# Patient Record
Sex: Male | Born: 1960 | Race: Black or African American | Hispanic: No | Marital: Married | Smoking: Former smoker
Health system: Southern US, Community
[De-identification: ages and names within clinical notes are randomized; demographics above are authoritative.]

## PROBLEM LIST (undated history)

## (undated) DIAGNOSIS — R21 Rash and other nonspecific skin eruption: Secondary | ICD-10-CM

## (undated) DIAGNOSIS — R9439 Abnormal result of other cardiovascular function study: Secondary | ICD-10-CM

## (undated) DIAGNOSIS — R519 Headache, unspecified: Secondary | ICD-10-CM

## (undated) DIAGNOSIS — E119 Type 2 diabetes mellitus without complications: Secondary | ICD-10-CM

## (undated) DIAGNOSIS — R51 Headache: Secondary | ICD-10-CM

## (undated) DIAGNOSIS — I10 Essential (primary) hypertension: Secondary | ICD-10-CM

## (undated) DIAGNOSIS — Z8639 Personal history of other endocrine, nutritional and metabolic disease: Secondary | ICD-10-CM

## (undated) HISTORY — DX: Abnormal result of other cardiovascular function study: R94.39

---

## 2005-11-19 ENCOUNTER — Encounter: Admission: RE | Admit: 2005-11-19 | Discharge: 2005-11-19 | Payer: Self-pay | Admitting: Family Medicine

## 2008-07-15 ENCOUNTER — Encounter: Admission: RE | Admit: 2008-07-15 | Discharge: 2008-07-15 | Payer: Self-pay | Admitting: Family Medicine

## 2011-05-14 ENCOUNTER — Ambulatory Visit
Admission: RE | Admit: 2011-05-14 | Discharge: 2011-05-14 | Disposition: A | Discharge status: Home / Self Care | Payer: 59 | Source: Ambulatory Visit | Attending: Family Medicine | Admitting: Family Medicine

## 2011-05-14 ENCOUNTER — Other Ambulatory Visit: Payer: Self-pay | Admitting: Family Medicine

## 2011-05-14 DIAGNOSIS — R52 Pain, unspecified: Secondary | ICD-10-CM

## 2013-12-31 ENCOUNTER — Other Ambulatory Visit: Payer: Self-pay | Admitting: Family Medicine

## 2013-12-31 ENCOUNTER — Ambulatory Visit (INDEPENDENT_AMBULATORY_CARE_PROVIDER_SITE_OTHER): Payer: Self-pay

## 2013-12-31 DIAGNOSIS — Z0289 Encounter for other administrative examinations: Secondary | ICD-10-CM

## 2013-12-31 DIAGNOSIS — Z021 Encounter for pre-employment examination: Secondary | ICD-10-CM

## 2014-09-12 NOTE — Progress Notes (Signed)
Please put orders in Epic surgery 09-22-14 pre op 09-21-14 Thanks

## 2014-09-14 ENCOUNTER — Ambulatory Visit: Payer: Self-pay | Admitting: Orthopedic Surgery

## 2014-09-14 NOTE — H&P (Signed)
Robert Sweeney is an 54 y.o. male.   Chief Complaint: left knee pain HPI: The patient is a 54 year old male who presents today for follow up of their knee. The patient is being followed for their left knee pain. They are now 10 week(s) out from injury (DOI 06/21/2014). Symptoms reported today include: pain. The patient feels that they are doing poorly. Current treatment includes: bracing, activity modification and NSAIDs. The following medication has been used for pain control: antiinflammatory medication. The patient presents today following MRI. Note for "Follow-up Knee": The patient is currently working with light duty restrictions of no lifting over 10 lbs and no squatting.  He follows up to go over the MRI of his knee. He is still having pain. Worse with activity, better with rest. He is 10 weeks status post his injury. He does have full thickness tear of the quad tendon. 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   No past medical history on file.  No past surgical history on file.  No family history on file. Social History:  has no tobacco, alcohol, and drug history on file.  Allergies: No Known Allergies   (Not in a hospital admission)  No results found for this or any previous visit (from the past 48 hour(s)). No results found.  Review of Systems  Constitutional: Negative.   HENT: Negative.   Eyes: Negative.   Respiratory: Negative.   Cardiovascular: Negative.   Gastrointestinal: Negative.   Genitourinary: Negative.   Musculoskeletal: Positive for joint pain.  Skin: Negative.   Neurological: Negative.   Psychiatric/Behavioral: Negative.     There were no vitals taken for this visit. Physical Exam  Constitutional: He is oriented to person, place, and time. He appears well-developed and well-nourished.  HENT:  Head: Normocephalic and atraumatic.  Eyes: Conjunctivae and EOM are normal. Pupils  are equal, round, and reactive to light.  Neck: Normal range of motion. Neck supple.  Cardiovascular: Normal rate and regular rhythm.   Respiratory: Effort normal and breath sounds normal.  GI: Soft. Bowel sounds are normal.  Musculoskeletal:  He does have extensor lag. He is neurologically intact. Tender in the medial joint line.  Neurological: He is alert and oriented to person, place, and time. He has normal reflexes.  Skin: Skin is warm and dry.  Psychiatric: He has a normal mood and affect.     Assessment/Plan 1. Quad tendon tear, now 10 weeks. 2. 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  As previously discussed, discussed repair of the quadriceps tendon. This has been delayed due to insurance approval coverage. Trying to maintain work status. Offered to keep him out of work, he declined. As far as restrictions, no squatting, no climbing, no lifting over 10 pounds. Discussed repair. Overnight in the hospital. In a brace. Three to four months until at maximal medical improvement. May require a delayed arthroscopic procedure. He understands that. His primary issue now is the quadriceps tendon. He does have a meniscal tear, DJD of the medial compartment as well. Spent over 25 minutes discussing the risks and benefits of the procedure including bleeding, infection, deep venous thrombosis, PE, and suboptimal range of motion, need for manipulation, need for arthroscopy in the future, etc. Clearance as soon as possible so we can repair those tendons. It is now 69 weeks old.  Plan left quadricep tendon repair, possible partial medial meniscectomy, possible patch graft  Robert Sweeney, Robert M. PA-C for Dr.  Beane 09/14/2014, 7:47 PM

## 2014-09-19 NOTE — Progress Notes (Signed)
Chest 1 view 12/31/13 on EPIC

## 2014-09-19 NOTE — Patient Instructions (Addendum)
Robert Sweeney  09/19/2014   Your procedure is scheduled on: Thursday 09/22/14  Report to [facility] Main  Entrance and follow signs to               Short Stay Center at 10:00 AM.  Call this number if you have problems the morning of surgery [phone]   Remember:  Do not eat food or drink liquids :After Midnight.                               You may not have any metal on your body including hair pins and              piercings  Do not wear jewelry, make-up, lotions, powders or perfumes.             Do not wear nail polish.  Do not shave  48 hours prior to surgery.              Men may shave face and neck.  Do not bring valuables to the hospital. [facility] IS NOT             RESPONSIBLE   FOR VALUABLES.  Contacts, dentures or bridgework may not be worn into surgery.  Leave suitcase in the car. After surgery it may be brought to your room.  _____________________________________________________________________           [facility] - Preparing for Surgery Before surgery, you can play an important role.  Because skin is not sterile, your skin needs to be as free of germs as possible.  You can reduce the number of germs on your skin by washing with CHG (chlorahexidine gluconate) soap before surgery.  CHG is an antiseptic cleaner which kills germs and bonds with the skin to continue killing germs even after washing. Please DO NOT use if you have an allergy to CHG or antibacterial soaps.  If your skin becomes reddened/irritated stop using the CHG and inform your nurse when you arrive at Short Stay. Do not shave (including legs and underarms) for at least 48 hours prior to the first CHG shower.  You may shave your face/neck. Please follow these instructions carefully:  1.  Shower with CHG Soap the night before surgery and the  morning of Surgery.  2.  If you choose to wash your hair, wash your hair first as usual with your  normal  shampoo.  3.  After you shampoo,  rinse your hair and body thoroughly to remove the  shampoo.                            4.  Use CHG as you would any other liquid soap.  You can apply chg directly  to the skin and wash                       Gently with a scrungie or clean washcloth.  5.  Apply the CHG Soap to your body ONLY FROM THE NECK DOWN.   Do not use on face/ open                           Wound or open sores. Avoid contact with eyes, ears mouth and genitals (private parts).  Wash face,  Genitals (private parts) with your normal soap.             6.  Wash thoroughly, paying special attention to the area where your surgery  will be performed.  7.  Thoroughly rinse your body with warm water from the neck down.  8.  DO NOT shower/wash with your normal soap after using and rinsing off  the CHG Soap.                9.  Pat yourself dry with a clean towel.            10.  Wear clean pajamas.            11.  Place clean sheets on your bed the night of your first shower and do not  sleep with pets. Day of Surgery : Do not apply any lotions/deodorants the morning of surgery.  Please wear clean clothes to the hospital/surgery center.  FAILURE TO FOLLOW THESE INSTRUCTIONS MAY RESULT IN THE CANCELLATION OF YOUR SURGERY PATIENT SIGNATURE_________________________________  NURSE SIGNATURE__________________________________  ________________________________________________________________________   Robert MireIncentive Spirometer  An incentive spirometer is a tool that can help keep your lungs clear and active. This tool measures how well you are filling your lungs with each breath. Taking long deep breaths may help reverse or decrease the chance of developing breathing (pulmonary) problems (especially infection) following:  A long period of time when you are unable to move or be active. BEFORE THE PROCEDURE   If the spirometer includes an indicator to show your best effort, your nurse or respiratory therapist will set  it to a desired goal.  If possible, sit up straight or lean slightly forward. Try not to slouch.  Hold the incentive spirometer in an upright position. INSTRUCTIONS FOR USE  1. Sit on the edge of your bed if possible, or sit up as far as you can in bed or on a chair. 2. Hold the incentive spirometer in an upright position. 3. Breathe out normally. 4. Place the mouthpiece in your mouth and seal your lips tightly around it. 5. Breathe in slowly and as deeply as possible, raising the piston or the ball toward the top of the column. 6. Hold your breath for 3-5 seconds or for as long as possible. Allow the piston or ball to fall to the bottom of the column. 7. Remove the mouthpiece from your mouth and breathe out normally. 8. Rest for a few seconds and repeat Steps 1 through 7 at least 10 times every 1-2 hours when you are awake. Take your time and take a few normal breaths between deep breaths. 9. The spirometer may include an indicator to show your best effort. Use the indicator as a goal to work toward during each repetition. 10. After each set of 10 deep breaths, practice coughing to be sure your lungs are clear. If you have an incision (the cut made at the time of surgery), support your incision when coughing by placing a pillow or rolled up towels firmly against it. Once you are able to get out of bed, walk around indoors and cough well. You may stop using the incentive spirometer when instructed by your caregiver.  RISKS AND COMPLICATIONS  Take your time so you do not get dizzy or light-headed.  If you are in pain, you may need to take or ask for pain medication before doing incentive spirometry. It is harder to take a deep breath if you are having pain.  AFTER USE  Rest and breathe slowly and easily.  It can be helpful to keep track of a log of your progress. Your caregiver can provide you with a simple table to help with this. If you are using the spirometer at home, follow these  instructions: [place] IF:   You are having difficultly using the spirometer.  You have trouble using the spirometer as often as instructed.  Your pain medication is not giving enough relief while using the spirometer.  You develop fever of 100.5 F (38.1 C) or higher. SEEK IMMEDIATE MEDICAL CARE IF:   You cough up bloody sputum that had not been present before.  You develop fever of 102 F (38.9 C) or greater.  You develop worsening pain at or near the incision site. MAKE SURE YOU:   Understand these instructions.  Will watch your condition.  Will get help right away if you are not doing well or get worse. Document Released: 11/04/2006 Document Revised: 09/16/2011 Document Reviewed: 01/05/2007 [facility] Patient Information 2014 [place], [place].   ________________________________________________________________________

## 2014-09-21 ENCOUNTER — Encounter (HOSPITAL_COMMUNITY): Payer: Self-pay

## 2014-09-21 ENCOUNTER — Encounter (HOSPITAL_COMMUNITY)
Admission: RE | Admit: 2014-09-21 | Discharge: 2014-09-21 | Disposition: A | Discharge status: Home / Self Care | Payer: Worker's Compensation | Source: Ambulatory Visit | Attending: Specialist | Admitting: Specialist

## 2014-09-21 DIAGNOSIS — E119 Type 2 diabetes mellitus without complications: Secondary | ICD-10-CM | POA: Diagnosis not present

## 2014-09-21 DIAGNOSIS — S76112A Strain of left quadriceps muscle, fascia and tendon, initial encounter: Secondary | ICD-10-CM | POA: Diagnosis present

## 2014-09-21 DIAGNOSIS — I10 Essential (primary) hypertension: Secondary | ICD-10-CM | POA: Diagnosis not present

## 2014-09-21 DIAGNOSIS — S76111A Strain of right quadriceps muscle, fascia and tendon, initial encounter: Secondary | ICD-10-CM | POA: Diagnosis not present

## 2014-09-21 DIAGNOSIS — X58XXXA Exposure to other specified factors, initial encounter: Secondary | ICD-10-CM | POA: Diagnosis not present

## 2014-09-21 DIAGNOSIS — Y9289 Other specified places as the place of occurrence of the external cause: Secondary | ICD-10-CM | POA: Diagnosis not present

## 2014-09-21 DIAGNOSIS — M179 Osteoarthritis of knee, unspecified: Secondary | ICD-10-CM | POA: Diagnosis not present

## 2014-09-21 HISTORY — DX: Headache, unspecified: R51.9

## 2014-09-21 HISTORY — DX: Headache: R51

## 2014-09-21 HISTORY — DX: Essential (primary) hypertension: I10

## 2014-09-21 HISTORY — DX: Personal history of other endocrine, nutritional and metabolic disease: Z86.39

## 2014-09-21 LAB — BASIC METABOLIC PANEL
ANION GAP: 8 (ref 5–15)
BUN: 17 mg/dL (ref 6–23)
CHLORIDE: 102 mmol/L (ref 96–112)
CO2: 30 mmol/L (ref 19–32)
CREATININE: 0.74 mg/dL (ref 0.50–1.35)
Calcium: 8.9 mg/dL (ref 8.4–10.5)
Glucose, Bld: 271 mg/dL — ABNORMAL HIGH (ref 70–99)
POTASSIUM: 4 mmol/L (ref 3.5–5.1)
SODIUM: 140 mmol/L (ref 135–145)

## 2014-09-21 LAB — CBC
HEMATOCRIT: 45.1 % (ref 39.0–52.0)
HEMOGLOBIN: 14.6 g/dL (ref 13.0–17.0)
MCH: 29.8 pg (ref 26.0–34.0)
MCHC: 32.4 g/dL (ref 30.0–36.0)
MCV: 92 fL (ref 78.0–100.0)
Platelets: 202 10*3/uL (ref 150–400)
RBC: 4.9 MIL/uL (ref 4.22–5.81)
RDW: 11.7 % (ref 11.5–15.5)
WBC: 6 10*3/uL (ref 4.0–10.5)

## 2014-09-21 NOTE — Progress Notes (Signed)
BMP results in epic per PAT visit 09/21/2014 sent to Dr Shelle IronBeane

## 2014-09-22 ENCOUNTER — Ambulatory Visit (HOSPITAL_COMMUNITY)
Admission: RE | Admit: 2014-09-22 | Discharge: 2014-09-23 | Disposition: A | Discharge status: Home / Self Care | Payer: Worker's Compensation | Source: Ambulatory Visit | Attending: Specialist | Admitting: Specialist

## 2014-09-22 ENCOUNTER — Ambulatory Visit (HOSPITAL_COMMUNITY): Payer: Worker's Compensation | Admitting: Anesthesiology

## 2014-09-22 ENCOUNTER — Encounter (HOSPITAL_COMMUNITY): Payer: Self-pay | Admitting: *Deleted

## 2014-09-22 ENCOUNTER — Encounter (HOSPITAL_COMMUNITY)
Admission: RE | Disposition: A | Discharge status: Home / Self Care | Payer: Self-pay | Source: Ambulatory Visit | Attending: Specialist

## 2014-09-22 DIAGNOSIS — S86919A Strain of unspecified muscle(s) and tendon(s) at lower leg level, unspecified leg, initial encounter: Secondary | ICD-10-CM | POA: Diagnosis present

## 2014-09-22 DIAGNOSIS — S76111A Strain of right quadriceps muscle, fascia and tendon, initial encounter: Secondary | ICD-10-CM | POA: Insufficient documentation

## 2014-09-22 DIAGNOSIS — I10 Essential (primary) hypertension: Secondary | ICD-10-CM | POA: Diagnosis not present

## 2014-09-22 DIAGNOSIS — X58XXXA Exposure to other specified factors, initial encounter: Secondary | ICD-10-CM | POA: Insufficient documentation

## 2014-09-22 DIAGNOSIS — E119 Type 2 diabetes mellitus without complications: Secondary | ICD-10-CM | POA: Insufficient documentation

## 2014-09-22 DIAGNOSIS — M179 Osteoarthritis of knee, unspecified: Secondary | ICD-10-CM | POA: Diagnosis not present

## 2014-09-22 DIAGNOSIS — Y9289 Other specified places as the place of occurrence of the external cause: Secondary | ICD-10-CM | POA: Insufficient documentation

## 2014-09-22 HISTORY — PX: QUADRICEPS TENDON REPAIR: SHX756

## 2014-09-22 LAB — CBC
HCT: 45.7 % (ref 39.0–52.0)
HEMOGLOBIN: 14.9 g/dL (ref 13.0–17.0)
MCH: 30.3 pg (ref 26.0–34.0)
MCHC: 32.6 g/dL (ref 30.0–36.0)
MCV: 92.9 fL (ref 78.0–100.0)
Platelets: 191 10*3/uL (ref 150–400)
RBC: 4.92 MIL/uL (ref 4.22–5.81)
RDW: 11.7 % (ref 11.5–15.5)
WBC: 8.4 10*3/uL (ref 4.0–10.5)

## 2014-09-22 LAB — CREATININE, SERUM
CREATININE: 0.93 mg/dL (ref 0.50–1.35)
GFR calc Af Amer: >90 mL/min (ref >90)
GFR calc non Af Amer: >90 mL/min (ref >90)

## 2014-09-22 LAB — GLUCOSE, CAPILLARY: Glucose-Capillary: 271 mg/dL — ABNORMAL HIGH (ref 70–99)

## 2014-09-22 SURGERY — REPAIR, TENDON, QUADRICEPS
Anesthesia: General | Laterality: Left

## 2014-09-22 MED ORDER — MAGNESIUM CITRATE PO SOLN: 1.0000 | Freq: Once | ORAL | Status: AC | PRN

## 2014-09-22 MED ORDER — METHOCARBAMOL 500 MG PO TABS: 500.0000 mg | ORAL_TABLET | Freq: Three times a day (TID) | ORAL | Status: DC | PRN

## 2014-09-22 MED ORDER — ACETAMINOPHEN 325 MG PO TABS: 650.0000 mg | ORAL_TABLET | Freq: Four times a day (QID) | ORAL | Status: DC | PRN

## 2014-09-22 MED ORDER — METOCLOPRAMIDE HCL 5 MG/ML IJ SOLN
INTRAMUSCULAR | Status: AC
  Filled 2014-09-22: qty 2

## 2014-09-22 MED ORDER — FENTANYL CITRATE 0.05 MG/ML IJ SOLN
INTRAMUSCULAR | Status: DC | PRN
  Administered 2014-09-22: 50 ug via INTRAVENOUS
  Administered 2014-09-22: 100 ug via INTRAVENOUS

## 2014-09-22 MED ORDER — BUPIVACAINE-EPINEPHRINE 0.5% -1:200000 IJ SOLN
INTRAMUSCULAR | Status: DC | PRN
  Administered 2014-09-22: 10 mL

## 2014-09-22 MED ORDER — HYDROMORPHONE HCL 1 MG/ML IJ SOLN
0.2500 mg | INTRAMUSCULAR | Status: DC | PRN
  Administered 2014-09-22 (×4): 0.5 mg via INTRAVENOUS

## 2014-09-22 MED ORDER — SODIUM CHLORIDE 0.9 % IJ SOLN
INTRAMUSCULAR | Status: AC
  Filled 2014-09-22: qty 10

## 2014-09-22 MED ORDER — ONDANSETRON HCL 4 MG/2ML IJ SOLN
INTRAMUSCULAR | Status: DC | PRN
  Administered 2014-09-22: 4 mg via INTRAVENOUS

## 2014-09-22 MED ORDER — SODIUM CHLORIDE 0.9 % IR SOLN
Status: DC | PRN
  Administered 2014-09-22: 500 mL

## 2014-09-22 MED ORDER — SENNOSIDES-DOCUSATE SODIUM 8.6-50 MG PO TABS: 1.0000 | ORAL_TABLET | Freq: Every evening | ORAL | Status: DC | PRN

## 2014-09-22 MED ORDER — MIDAZOLAM HCL 5 MG/5ML IJ SOLN
INTRAMUSCULAR | Status: DC | PRN
  Administered 2014-09-22: 2 mg via INTRAVENOUS

## 2014-09-22 MED ORDER — OXYCODONE HCL 5 MG PO TABS: 5.0000 mg | ORAL_TABLET | Freq: Once | ORAL | Status: DC | PRN

## 2014-09-22 MED ORDER — PROMETHAZINE HCL 25 MG/ML IJ SOLN: 6.2500 mg | INTRAMUSCULAR | Status: DC | PRN

## 2014-09-22 MED ORDER — CEFAZOLIN SODIUM-DEXTROSE 2-3 GM-% IV SOLR
2.0000 g | INTRAVENOUS | Status: AC
  Administered 2014-09-22: 2 g via INTRAVENOUS

## 2014-09-22 MED ORDER — CEFAZOLIN SODIUM-DEXTROSE 2-3 GM-% IV SOLR
2.0000 g | Freq: Four times a day (QID) | INTRAVENOUS | Status: AC
  Administered 2014-09-22 – 2014-09-23 (×2): 2 g via INTRAVENOUS
  Filled 2014-09-22 (×2): qty 50

## 2014-09-22 MED ORDER — HYDROMORPHONE HCL 1 MG/ML IJ SOLN: 1.0000 mg | INTRAMUSCULAR | Status: DC | PRN

## 2014-09-22 MED ORDER — MENTHOL 3 MG MT LOZG: 1.0000 | LOZENGE | OROMUCOSAL | Status: DC | PRN

## 2014-09-22 MED ORDER — KCL IN DEXTROSE-NACL 20-5-0.45 MEQ/L-%-% IV SOLN
INTRAVENOUS | Status: DC
  Administered 2014-09-23: 01:00:00 via INTRAVENOUS
  Filled 2014-09-22 (×2): qty 1000

## 2014-09-22 MED ORDER — OXYCODONE HCL 5 MG PO TABS
5.0000 mg | ORAL_TABLET | ORAL | Status: DC | PRN
  Administered 2014-09-22 – 2014-09-23 (×5): 10 mg via ORAL
  Filled 2014-09-22 (×5): qty 2

## 2014-09-22 MED ORDER — METOCLOPRAMIDE HCL 5 MG/ML IJ SOLN
INTRAMUSCULAR | Status: DC | PRN
  Administered 2014-09-22: 10 mg via INTRAVENOUS

## 2014-09-22 MED ORDER — ALUM & MAG HYDROXIDE-SIMETH 200-200-20 MG/5ML PO SUSP: 30.0000 mL | ORAL | Status: DC | PRN

## 2014-09-22 MED ORDER — DOCUSATE SODIUM 100 MG PO CAPS: 100.0000 mg | ORAL_CAPSULE | Freq: Two times a day (BID) | ORAL | Status: DC | PRN

## 2014-09-22 MED ORDER — METHOCARBAMOL 1000 MG/10ML IJ SOLN
500.0000 mg | Freq: Four times a day (QID) | INTRAVENOUS | Status: DC | PRN
  Administered 2014-09-22: 500 mg via INTRAVENOUS
  Filled 2014-09-22 (×2): qty 5

## 2014-09-22 MED ORDER — OXYCODONE-ACETAMINOPHEN 5-325 MG PO TABS: 1.0000 | ORAL_TABLET | ORAL | Status: DC | PRN

## 2014-09-22 MED ORDER — EPHEDRINE SULFATE 50 MG/ML IJ SOLN
INTRAMUSCULAR | Status: DC | PRN
  Administered 2014-09-22: 10 mg via INTRAVENOUS
  Administered 2014-09-22: 5 mg via INTRAVENOUS

## 2014-09-22 MED ORDER — HYDROMORPHONE HCL 1 MG/ML IJ SOLN
INTRAMUSCULAR | Status: AC
  Filled 2014-09-22: qty 1

## 2014-09-22 MED ORDER — MIDAZOLAM HCL 2 MG/2ML IJ SOLN
INTRAMUSCULAR | Status: AC
  Filled 2014-09-22: qty 2

## 2014-09-22 MED ORDER — GLYCOPYRROLATE 0.2 MG/ML IJ SOLN
INTRAMUSCULAR | Status: AC
  Filled 2014-09-22: qty 4

## 2014-09-22 MED ORDER — ONDANSETRON HCL 4 MG/2ML IJ SOLN: 4.0000 mg | Freq: Four times a day (QID) | INTRAMUSCULAR | Status: DC | PRN

## 2014-09-22 MED ORDER — DIPHENHYDRAMINE HCL 12.5 MG/5ML PO ELIX: 12.5000 mg | ORAL_SOLUTION | ORAL | Status: DC | PRN

## 2014-09-22 MED ORDER — DEXAMETHASONE SODIUM PHOSPHATE 10 MG/ML IJ SOLN
INTRAMUSCULAR | Status: DC | PRN
  Administered 2014-09-22: 10 mg via INTRAVENOUS

## 2014-09-22 MED ORDER — ONDANSETRON HCL 4 MG/2ML IJ SOLN
INTRAMUSCULAR | Status: AC
  Filled 2014-09-22: qty 2

## 2014-09-22 MED ORDER — ACETAMINOPHEN 650 MG RE SUPP: 650.0000 mg | Freq: Four times a day (QID) | RECTAL | Status: DC | PRN

## 2014-09-22 MED ORDER — FENTANYL CITRATE 0.05 MG/ML IJ SOLN
INTRAMUSCULAR | Status: AC
  Filled 2014-09-22: qty 5

## 2014-09-22 MED ORDER — METHOCARBAMOL 500 MG PO TABS
500.0000 mg | ORAL_TABLET | Freq: Four times a day (QID) | ORAL | Status: DC | PRN
  Administered 2014-09-22 – 2014-09-23 (×2): 500 mg via ORAL
  Filled 2014-09-22 (×2): qty 1

## 2014-09-22 MED ORDER — ONDANSETRON HCL 4 MG PO TABS: 4.0000 mg | ORAL_TABLET | Freq: Four times a day (QID) | ORAL | Status: DC | PRN

## 2014-09-22 MED ORDER — DEXAMETHASONE SODIUM PHOSPHATE 10 MG/ML IJ SOLN
INTRAMUSCULAR | Status: AC
  Filled 2014-09-22: qty 1

## 2014-09-22 MED ORDER — CISATRACURIUM BESYLATE 20 MG/10ML IV SOLN
INTRAVENOUS | Status: AC
  Filled 2014-09-22: qty 10

## 2014-09-22 MED ORDER — ENOXAPARIN SODIUM 30 MG/0.3ML ~~LOC~~ SOLN
30.0000 mg | Freq: Two times a day (BID) | SUBCUTANEOUS | Status: DC
  Filled 2014-09-22 (×3): qty 0.3

## 2014-09-22 MED ORDER — METOCLOPRAMIDE HCL 5 MG/ML IJ SOLN: 5.0000 mg | Freq: Three times a day (TID) | INTRAMUSCULAR | Status: DC | PRN

## 2014-09-22 MED ORDER — BISACODYL 5 MG PO TBEC: 5.0000 mg | DELAYED_RELEASE_TABLET | Freq: Every day | ORAL | Status: DC | PRN

## 2014-09-22 MED ORDER — LACTATED RINGERS IV SOLN
INTRAVENOUS | Status: DC
  Administered 2014-09-22 (×2): via INTRAVENOUS

## 2014-09-22 MED ORDER — PROPOFOL 10 MG/ML IV BOLUS
INTRAVENOUS | Status: AC
  Filled 2014-09-22: qty 20

## 2014-09-22 MED ORDER — OXYCODONE HCL 5 MG/5ML PO SOLN
5.0000 mg | Freq: Once | ORAL | Status: DC | PRN
  Filled 2014-09-22: qty 5

## 2014-09-22 MED ORDER — PHENOL 1.4 % MT LIQD
1.0000 | OROMUCOSAL | Status: DC | PRN
Start: 2014-09-22 — End: 2014-09-23

## 2014-09-22 MED ORDER — BUPIVACAINE-EPINEPHRINE (PF) 0.5% -1:200000 IJ SOLN
INTRAMUSCULAR | Status: AC
Start: 2014-09-22 — End: 2014-09-22
  Filled 2014-09-22: qty 30

## 2014-09-22 MED ORDER — METOCLOPRAMIDE HCL 10 MG PO TABS: 5.0000 mg | ORAL_TABLET | Freq: Three times a day (TID) | ORAL | Status: DC | PRN

## 2014-09-22 MED ORDER — CEFAZOLIN SODIUM-DEXTROSE 2-3 GM-% IV SOLR
INTRAVENOUS | Status: AC
Start: 2014-09-22 — End: 2014-09-22
  Filled 2014-09-22: qty 50

## 2014-09-22 MED ORDER — PROPOFOL 10 MG/ML IV BOLUS
INTRAVENOUS | Status: DC | PRN
  Administered 2014-09-22: 200 mg via INTRAVENOUS

## 2014-09-22 MED ORDER — EPHEDRINE SULFATE 50 MG/ML IJ SOLN
INTRAMUSCULAR | Status: AC
  Filled 2014-09-22: qty 1

## 2014-09-22 MED ORDER — DOCUSATE SODIUM 100 MG PO CAPS
100.0000 mg | ORAL_CAPSULE | Freq: Two times a day (BID) | ORAL | Status: DC
  Administered 2014-09-22 – 2014-09-23 (×2): 100 mg via ORAL

## 2014-09-22 MED ORDER — CISATRACURIUM BESYLATE (PF) 10 MG/5ML IV SOLN
INTRAVENOUS | Status: DC | PRN
  Administered 2014-09-22: 12 mg via INTRAVENOUS

## 2014-09-22 SURGICAL SUPPLY — 60 items
BAG ZIPLOCK 12X15 (MISCELLANEOUS) IMPLANT
BANDAGE ESMARK 6X9 LF (GAUZE/BANDAGES/DRESSINGS) ×1 IMPLANT
BIT DRILL 2.4X128 (BIT) IMPLANT
BIT DRILL 2.4X128MM (BIT)
BIT DRILL 2.8X128 (BIT) ×2 IMPLANT
BIT DRILL 2.8X128MM (BIT) ×1
BNDG ELASTIC 6X10 VLCR STRL LF (GAUZE/BANDAGES/DRESSINGS) ×3 IMPLANT
BNDG ELASTIC 6X15 VLCR STRL LF (GAUZE/BANDAGES/DRESSINGS) ×3 IMPLANT
BNDG ESMARK 6X9 LF (GAUZE/BANDAGES/DRESSINGS) ×3
CLOTH 2% CHLOROHEXIDINE 3PK (PERSONAL CARE ITEMS) ×3 IMPLANT
CUFF TOURN SGL QUICK 34 (TOURNIQUET CUFF) ×2
CUFF TRNQT CYL 34X4X40X1 (TOURNIQUET CUFF) ×1 IMPLANT
DERMABOND ADVANCED (GAUZE/BANDAGES/DRESSINGS) ×2
DERMABOND ADVANCED .7 DNX12 (GAUZE/BANDAGES/DRESSINGS) ×1 IMPLANT
DERMASPAN .5-.9MM 4X4CM SHOU (Miscellaneous) ×3 IMPLANT
DRAPE ORTHO SPLIT 77X108 STRL (DRAPES) ×4
DRAPE POUCH INSTRU U-SHP 10X18 (DRAPES) ×3 IMPLANT
DRAPE SHEET LG 3/4 BI-LAMINATE (DRAPES) IMPLANT
DRAPE SURG ORHT 6 SPLT 77X108 (DRAPES) ×2 IMPLANT
DRAPE U-SHAPE 47X51 STRL (DRAPES) ×3 IMPLANT
DRSG ADAPTIC 3X8 NADH LF (GAUZE/BANDAGES/DRESSINGS) IMPLANT
DRSG AQUACEL AG ADV 3.5X10 (GAUZE/BANDAGES/DRESSINGS) IMPLANT
DRSG PAD ABDOMINAL 8X10 ST (GAUZE/BANDAGES/DRESSINGS) IMPLANT
DURAPREP 26ML APPLICATOR (WOUND CARE) ×3 IMPLANT
ELECT REM PT RETURN 9FT ADLT (ELECTROSURGICAL) ×3
ELECTRODE REM PT RTRN 9FT ADLT (ELECTROSURGICAL) ×1 IMPLANT
GAUZE SPONGE 4X4 12PLY STRL (GAUZE/BANDAGES/DRESSINGS) IMPLANT
GLOVE BIOGEL PI IND STRL 7.5 (GLOVE) ×1 IMPLANT
GLOVE BIOGEL PI IND STRL 8 (GLOVE) ×1 IMPLANT
GLOVE BIOGEL PI INDICATOR 7.5 (GLOVE) ×2
GLOVE BIOGEL PI INDICATOR 8 (GLOVE) ×2
GLOVE SURG SS PI 7.5 STRL IVOR (GLOVE) ×3 IMPLANT
GLOVE SURG SS PI 8.0 STRL IVOR (GLOVE) ×3 IMPLANT
GOWN STRL REUS W/TWL XL LVL3 (GOWN DISPOSABLE) ×6 IMPLANT
IMMOBILIZER KNEE 20 (SOFTGOODS) ×3
IMMOBILIZER KNEE 20 THIGH 36 (SOFTGOODS) ×1 IMPLANT
KIT BASIN OR (CUSTOM PROCEDURE TRAY) ×3 IMPLANT
MANIFOLD NEPTUNE II (INSTRUMENTS) ×3 IMPLANT
NEEDLE HYPO 22GX1.5 SAFETY (NEEDLE) ×3 IMPLANT
NEEDLE MA TROC 1/2 CIR (NEEDLE) IMPLANT
NEEDLE MAYO .5 CIRCLE (NEEDLE) IMPLANT
PACK TOTAL JOINT (CUSTOM PROCEDURE TRAY) ×3 IMPLANT
PADDING CAST COTTON 6X4 STRL (CAST SUPPLIES) IMPLANT
PASSER SUT SWANSON 36MM LOOP (INSTRUMENTS) IMPLANT
POSITIONER SURGICAL ARM (MISCELLANEOUS) ×3 IMPLANT
STAPLER VISISTAT (STAPLE) ×3 IMPLANT
SUT ETHIBOND 5 LR DA (SUTURE) IMPLANT
SUT ETHIBOND NAB CT1 #1 30IN (SUTURE) IMPLANT
SUT FIBERWIRE #2 38 T-5 BLUE (SUTURE) ×6
SUT VIC AB 0 CT1 27 (SUTURE)
SUT VIC AB 0 CT1 27XBRD ANTBC (SUTURE) IMPLANT
SUT VIC AB 1 CT1 27 (SUTURE) ×4
SUT VIC AB 1 CT1 27XBRD ANTBC (SUTURE) ×2 IMPLANT
SUT VIC AB 2-0 CT1 27 (SUTURE) ×4
SUT VIC AB 2-0 CT1 TAPERPNT 27 (SUTURE) ×2 IMPLANT
SUTURE FIBERWR #2 38 T-5 BLUE (SUTURE) ×2 IMPLANT
SYRINGE 20CC LL (MISCELLANEOUS) ×3 IMPLANT
TOWEL OR 17X26 10 PK STRL BLUE (TOWEL DISPOSABLE) ×3 IMPLANT
TOWEL OR NON WOVEN STRL DISP B (DISPOSABLE) IMPLANT
WRAP KNEE MAXI GEL POST OP (GAUZE/BANDAGES/DRESSINGS) ×3 IMPLANT

## 2014-09-22 NOTE — H&P (View-Only) (Signed)
Robert Sweeney is an 53 y.o. male.   Chief Complaint: left knee pain HPI: The patient is a 53 year old male who presents today for follow up of their knee. The patient is being followed for their left knee pain. They are now 10 week(s) out from injury (DOI 06/21/2014). Symptoms reported today include: pain. The patient feels that they are doing poorly. Current treatment includes: bracing, activity modification and NSAIDs. The following medication has been used for pain control: antiinflammatory medication. The patient presents today following MRI. Note for "Follow-up Knee": The patient is currently working with light duty restrictions of no lifting over 10 lbs and no squatting.  He follows up to go over the MRI of his knee. He is still having pain. Worse with activity, better with rest. He is 10 weeks status post his injury. He does have full thickness tear of the quad tendon. 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   No past medical history on file.  No past surgical history on file.  No family history on file. Social History:  has no tobacco, alcohol, and drug history on file.  Allergies: No Known Allergies   (Not in a hospital admission)  No results found for this or any previous visit (from the past 48 hour(s)). No results found.  Review of Systems  Constitutional: Negative.   HENT: Negative.   Eyes: Negative.   Respiratory: Negative.   Cardiovascular: Negative.   Gastrointestinal: Negative.   Genitourinary: Negative.   Musculoskeletal: Positive for joint pain.  Skin: Negative.   Neurological: Negative.   Psychiatric/Behavioral: Negative.     There were no vitals taken for this visit. Physical Exam  Constitutional: He is oriented to person, place, and time. He appears well-developed and well-nourished.  HENT:  Head: Normocephalic and atraumatic.  Eyes: Conjunctivae and EOM are normal. Pupils  are equal, round, and reactive to light.  Neck: Normal range of motion. Neck supple.  Cardiovascular: Normal rate and regular rhythm.   Respiratory: Effort normal and breath sounds normal.  GI: Soft. Bowel sounds are normal.  Musculoskeletal:  He does have extensor lag. He is neurologically intact. Tender in the medial joint line.  Neurological: He is alert and oriented to person, place, and time. He has normal reflexes.  Skin: Skin is warm and dry.  Psychiatric: He has a normal mood and affect.     Assessment/Plan 1. Quad tendon tear, now 10 weeks. 2. 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  As previously discussed, discussed repair of the quadriceps tendon. This has been delayed due to insurance approval coverage. Trying to maintain work status. Offered to keep him out of work, he declined. As far as restrictions, no squatting, no climbing, no lifting over 10 pounds. Discussed repair. Overnight in the hospital. In a brace. Three to four months until at maximal medical improvement. May require a delayed arthroscopic procedure. He understands that. His primary issue now is the quadriceps tendon. He does have a meniscal tear, DJD of the medial compartment as well. Spent over 25 minutes discussing the risks and benefits of the procedure including bleeding, infection, deep venous thrombosis, PE, and suboptimal range of motion, need for manipulation, need for arthroscopy in the future, etc. Clearance as soon as possible so we can repair those tendons. It is now 10 weeks old.  Plan left quadricep tendon repair, possible partial medial meniscectomy, possible patch graft  BISSELL, JACLYN M. PA-C for Dr.   Beane 09/14/2014, 7:47 PM    

## 2014-09-22 NOTE — Anesthesia Procedure Notes (Signed)
Procedure Name: Intubation Date/Time: 09/22/2014 11:55 AM Performed by: Paulla DollyJOYCE, Jeremie Abdelaziz A Pre-anesthesia Checklist: Patient identified, Emergency Drugs available, Suction available, Patient being monitored and Timeout performed Patient Re-evaluated:Patient Re-evaluated prior to inductionOxygen Delivery Method: Circle system utilized Preoxygenation: Pre-oxygenation with 100% oxygen Intubation Type: Combination inhalational/ intravenous induction Ventilation: Mask ventilation without difficulty Laryngoscope Size: Mac and 4 Grade View: Grade II Tube type: Oral Tube size: 8.0 mm Number of attempts: 1 Airway Equipment and Method: Stylet Placement Confirmation: ETT inserted through vocal cords under direct vision,  breath sounds checked- equal and bilateral and positive ETCO2 Secured at: 22 cm Tube secured with: Tape Dental Injury: Teeth and Oropharynx as per pre-operative assessment

## 2014-09-22 NOTE — Transfer of Care (Signed)
Immediate Anesthesia Transfer of Care Note  Patient: Robert Sweeney  Procedure(s) Performed: Procedure(s): REPAIR LEFT QUADRICEP TENDON with patch graft (Left)  Patient Location: PACU  Anesthesia Type:General  Level of Consciousness: awake, sedated and patient cooperative  Airway & Oxygen Therapy: Patient Spontanous Breathing and Patient connected to face mask oxygen  Post-op Assessment: Report given to RN and Post -op Vital signs reviewed and stable  Post vital signs: Reviewed and stable  Last Vitals:  Filed Vitals:   09/22/14 1007  BP: 163/88  Pulse: 71  Temp: 36.7 C  Resp: 18    Complications: No apparent anesthesia complications

## 2014-09-22 NOTE — Discharge Instructions (Signed)
Remain in Bledsoe brace at all times, locked in full extension (knee straight) May weightbear on heel in bledsoe brace Keep incision and dressings clean and dry at all times Ice and elevate the left knee 5-6x/day 20 minutes each to reduce swelling, toes above the nose Take Aspirin 325mg  once daily to reduce risk of blood clots Follow up in 2 weeks with Dr. Shelle IronBeane in office to remove staples

## 2014-09-22 NOTE — Interval H&P Note (Signed)
History and Physical Interval Note:  09/22/2014 7:30 AM  Robert Sweeney  has presented today for surgery, with the diagnosis of QUADRICEP TENDON TEAR LEFT   The various methods of treatment have been discussed with the patient and family. After consideration of risks, benefits and other options for treatment, the patient has consented to  Procedure(s): REPAIR LEFT QUADRICEP TENDON  (Left) POSSIBLE PARTIAL MENISCECTOMY POSSIBLE PATCH GRAFT  (Left) as a surgical intervention .  The patient's history has been reviewed, patient examined, no change in status, stable for surgery.  I have reviewed the patient's chart and labs.  Questions were answered to the patient's satisfaction.     Edwar Coe C

## 2014-09-22 NOTE — Brief Op Note (Signed)
09/22/2014  1:21 PM  PATIENT:  Robert Sweeney  54 y.o. male  PRE-OPERATIVE DIAGNOSIS:  QUADRICEP TENDON TEAR LEFT   POST-OPERATIVE DIAGNOSIS:  QUADRICEP TENDON TEAR LEFT   PROCEDURE:  Procedure(s): REPAIR LEFT QUADRICEP TENDON with patch graft (Left)  SURGEON:  Surgeon(s) and Role:    * Jene EveryJeffrey Jamarquis Crull, MD - Primary  PHYSICIAN ASSISTANT:   ASSISTANTS: Bissell   ANESTHESIA:   general  EBL:  Total I/O In: 1000 [I.V.:1000] Out: 100 [Blood:100]  BLOOD ADMINISTERED:none  DRAINS: none   LOCAL MEDICATIONS USED:  MARCAINE     SPECIMEN:  No Specimen  DISPOSITION OF SPECIMEN:  N/A  COUNTS:  YES  TOURNIQUET:  * No tourniquets in log *  DICTATION: .Other Dictation: Dictation Number [id number]  PLAN OF CARE: Admit for overnight observation  PATIENT DISPOSITION:  PACU - hemodynamically stable.   Delay start of Pharmacological VTE agent (>24hrs) due to surgical blood loss or risk of bleeding: n0

## 2014-09-22 NOTE — Anesthesia Postprocedure Evaluation (Signed)
  Anesthesia Post-op Note  Patient: Robert Sweeney  Procedure(s) Performed: Procedure(s): REPAIR LEFT QUADRICEP TENDON with patch graft (Left)  Patient Location: PACU  Anesthesia Type:General  Level of Consciousness: awake and alert   Airway and Oxygen Therapy: Patient Spontanous Breathing  Post-op Pain: mild  Post-op Assessment: Post-op Vital signs reviewed  Post-op Vital Signs: stable  Last Vitals:  Filed Vitals:   09/22/14 1508  BP: 164/84  Pulse: 71  Temp: 36.6 C  Resp: 16    Complications: No apparent anesthesia complications

## 2014-09-22 NOTE — Anesthesia Preprocedure Evaluation (Addendum)
Anesthesia Evaluation  Patient identified by MRN, date of birth, ID band Patient awake    Reviewed: Allergy & Precautions, NPO status , Patient's Chart, lab work & pertinent test results  History of Anesthesia Complications Negative for: history of anesthetic complications  Airway Mallampati: I  TM Distance: >3 FB Neck ROM: Full    Dental  (+) Teeth Intact   Pulmonary neg pulmonary ROS, former smoker,  breath sounds clear to auscultation        Cardiovascular hypertension, Rhythm:Regular Rate:Normal     Neuro/Psych    GI/Hepatic negative GI ROS, Neg liver ROS,   Endo/Other  negative endocrine ROS  Renal/GU negative Renal ROS     Musculoskeletal   Abdominal   Peds  Hematology negative hematology ROS (+)   Anesthesia Other Findings   Reproductive/Obstetrics                            Anesthesia Physical Anesthesia Plan  ASA: II  Anesthesia Plan: General   Post-op Pain Management:    Induction: Intravenous  Airway Management Planned: Oral ETT  Additional Equipment:   Intra-op Plan:   Post-operative Plan: Extubation in OR  Informed Consent: I have reviewed the patients History and Physical, chart, labs and discussed the procedure including the risks, benefits and alternatives for the proposed anesthesia with the patient or authorized representative who has indicated his/her understanding and acceptance.   Dental advisory given  Plan Discussed with: CRNA and Surgeon  Anesthesia Plan Comments:         Anesthesia Quick Evaluation

## 2014-09-23 ENCOUNTER — Encounter (HOSPITAL_COMMUNITY): Payer: Self-pay | Admitting: Specialist

## 2014-09-23 DIAGNOSIS — S76111A Strain of right quadriceps muscle, fascia and tendon, initial encounter: Secondary | ICD-10-CM | POA: Diagnosis not present

## 2014-09-23 NOTE — Progress Notes (Signed)
Pt to d/c home. Crutches delivered to room before d/c. Medicated for pain before d/c. AVS reviewed and "My Chart" discussed with pt. Pt capable of verbalizing medications, dressing changes, signs and symptoms of infection, and follow-up appointments. Remains hemodynamically stable. No signs and symptoms of distress. Educated pt to return to ER in the case of SOB, dizziness, or chest pain.

## 2014-09-23 NOTE — Progress Notes (Addendum)
Subjective: 1 Day Post-Op Procedure(s) (LRB): REPAIR LEFT QUADRICEP TENDON with patch graft (Left) Patient reports pain as mild.  Pain well controlled with OxyIR. No other c/o. No calf pain, numbness, tingling. Feels ready to go home later today. Bledsoe brace has been fitted and feels it is much better than the knee immobilizer.  Objective: Vital signs in last 24 hours: Temp:  [97.5 F (36.4 C)-98.5 F (36.9 C)] 98.5 F (36.9 C) (03/18 0625) Pulse Rate:  [56-91] 72 (03/18 0625) Resp:  [15-25] 16 (03/18 0752) BP: (141-181)/(67-93) 144/73 mmHg (03/18 0625) SpO2:  [95 %-100 %] 97 % (03/18 0752) Weight:  [117.028 kg (258 lb)] 117.028 kg (258 lb) (03/17 1051)  Intake/Output from previous day: 03/17 0701 - 03/18 0700 In: 2055 [I.V.:2000; IV Piggyback:55] Out: 1150 [Urine:1050; Blood:100] Intake/Output this shift:     Recent Labs  09/21/14 1135 09/22/14 1614  HGB 14.6 14.9    Recent Labs  09/21/14 1135 09/22/14 1614  WBC 6.0 8.4  RBC 4.90 4.92  HCT 45.1 45.7  PLT 202 191    Recent Labs  09/21/14 1135 09/22/14 1614  NA 140  --   K 4.0  --   CL 102  --   CO2 30  --   BUN 17  --   CREATININE 0.74 0.93  GLUCOSE 271*  --   CALCIUM 8.9  --    No results for input(s): LABPT, INR in the last 72 hours.  Neurologically intact ABD soft Neurovascular intact Sensation intact distally Intact pulses distally Dorsiflexion/Plantar flexion intact Incision: dressing C/D/I and no drainage No cellulitis present Compartment soft no sign of DVT, no calf pain  Assessment/Plan: 1 Day Post-Op Procedure(s) (LRB): REPAIR LEFT QUADRICEP TENDON with patch graft (Left) Advance diet Up with therapy D/C IV fluids  Plan D/C after PT, will likely need walker or crutches, await PT recommendations Discussed D/C instructions May WB on heel in Bledsoe brace locked in extension which is to remain on at all times Follow up in office in 2 weeks for staple removal and xrays Will discuss  with Dr. Elissa LovettBeane  Shaka Zech M. 09/23/2014, 8:00 AM

## 2014-09-23 NOTE — Discharge Summary (Signed)
Patient ID: Robert BranchDavid A Guertin MRN: [id number] DOB/AGE: [birth year] 56 y.o.  Admit date: 09/22/2014 Discharge date: 09/23/2014  Admission Diagnoses:  Active Problems:   Tear of tendon of lower extremity   Discharge Diagnoses:  Same  Past Medical History  Diagnosis Date  . Hypertension     under control through diet and exercise  . History of diabetes mellitus, type II     diet controlled-"no medications needed anymore"  . Headache     sometimes    Surgeries: Procedure(s): REPAIR LEFT QUADRICEP TENDON with patch graft on 09/22/2014   Consultants:    Discharged Condition: Improved  Hospital Course: Robert BranchDavid A Wandell is an 54 y.o. male who was admitted 09/22/2014 for operative treatment of<principal problem not specified>. Patient has severe unremitting pain that affects sleep, daily activities, and work/hobbies. After pre-op clearance the patient was taken to the operating room on 09/22/2014 and underwent  Procedure(s): REPAIR LEFT QUADRICEP TENDON with patch graft.    Patient was given perioperative antibiotics: Anti-infectives    Start     Dose/Rate Route Frequency Ordered Stop   09/22/14 1800  ceFAZolin (ANCEF) IVPB 2 g/50 mL premix     2 g 100 mL/hr over 30 Minutes Intravenous Every 6 hours 09/22/14 1526 09/23/14 0105   09/22/14 1218  polymyxin B 500,000 Units, bacitracin 50,000 Units in sodium chloride irrigation 0.9 % 500 mL irrigation  Status:  Discontinued       As needed 09/22/14 1218 09/22/14 1329   09/22/14 1015  ceFAZolin (ANCEF) IVPB 2 g/50 mL premix     2 g 100 mL/hr over 30 Minutes Intravenous On call to O.R. 09/22/14 1007 09/22/14 1145       Patient was given sequential compression devices, early ambulation, and chemoprophylaxis to prevent DVT.  Patient benefited maximally from hospital stay and there were no complications.    Recent vital signs: Patient Vitals for the past 24 hrs:  BP Temp Temp src Pulse Resp SpO2 Height Weight  09/23/14 0752 - - - - 16 97 % - -   09/23/14 0625 (!) 144/73 mmHg 98.5 F (36.9 C) Oral 72 18 98 % - -  09/22/14 2133 (!) 158/67 mmHg 98.3 F (36.8 C) Oral 89 20 95 % - -  09/22/14 1808 (!) 141/88 mmHg 98.2 F (36.8 C) Axillary 91 20 97 % - -  09/22/14 1708 (!) 152/80 mmHg 97.6 F (36.4 C) Oral 72 20 98 % - -  09/22/14 1608 (!) 146/79 mmHg 97.5 F (36.4 C) Oral 74 20 100 % - -  09/22/14 1600 - - - - 20 100 % - -  09/22/14 1508 (!) 164/84 mmHg 97.8 F (36.6 C) - 71 16 100 % - -  09/22/14 1445 (!) 177/86 mmHg 97.8 F (36.6 C) - 72 (!) 21 99 % - -  09/22/14 1430 (!) 170/89 mmHg - - 64 19 98 % - -  09/22/14 1415 (!) 178/86 mmHg - - (!) 56 (!) 25 99 % - -  09/22/14 1410 (!) 178/91 mmHg - - - 19 100 % - -  09/22/14 1400 (!) 181/93 mmHg - - 77 (!) 21 99 % - -  09/22/14 1345 (!) 160/88 mmHg - - 65 (!) 21 100 % - -  09/22/14 1334 (!) 143/84 mmHg 97.5 F (36.4 C) - 70 15 96 % - -  09/22/14 1051 - - - - - - 6' 5.5" (1.969 m) 117.028 kg (258 lb)  09/22/14 1007 (!) 163/88 mmHg 98.1  F (36.7 C) Oral 71 18 97 % - -     Recent laboratory studies:  Recent Labs  09/21/14 1135 09/22/14 1614  WBC 6.0 8.4  HGB 14.6 14.9  HCT 45.1 45.7  PLT 202 191  NA 140  --   K 4.0  --   CL 102  --   CO2 30  --   BUN 17  --   CREATININE 0.74 0.93  GLUCOSE 271*  --   CALCIUM 8.9  --      Discharge Medications:     Medication List    TAKE these medications        docusate sodium 100 MG capsule  Commonly known as:  COLACE  Take 1 capsule (100 mg total) by mouth 2 (two) times daily as needed for mild constipation.     methocarbamol 500 MG tablet  Commonly known as:  ROBAXIN  Take 1 tablet (500 mg total) by mouth 3 (three) times daily between meals as needed for muscle spasms.     multivitamin with minerals Tabs tablet  Take 1 tablet by mouth every morning.     oxyCODONE-acetaminophen 5-325 MG per tablet  Commonly known as:  PERCOCET  Take 1 tablet by mouth every 4 (four) hours as needed.        Diagnostic Studies: No  results found.  Disposition: Final discharge disposition not confirmed      Discharge Instructions    Call MD / Call 911    Complete by:  As directed   If you experience chest pain or shortness of breath, CALL 911 and be transported to the hospital emergency room.  If you develope a fever above 101 F, pus (white drainage) or increased drainage or redness at the wound, or calf pain, call your surgeon's office.     Constipation Prevention    Complete by:  As directed   Drink plenty of fluids.  Prune juice may be helpful.  You may use a stool softener, such as Colace (over the counter) 100 mg twice a day.  Use MiraLax (over the counter) for constipation as needed.     Diet - low sodium heart healthy    Complete by:  As directed      Increase activity slowly as tolerated    Complete by:  As directed            Follow-up Information    Follow up with BEANE,JEFFREY C, MD In 2 weeks.   Specialty:  Orthopedic Surgery   Why:  For suture removal   Contact information:   [address] [place] [address] [phone]        Signed: Dorothy Spark. 09/23/2014, 8:03 AM

## 2014-09-23 NOTE — Evaluation (Signed)
Physical Therapy Evaluation Patient Details Name: Robert Sweeney MRN: [id number] DOB: Jul 09, 1960 Today's Date: 09/23/2014   History of Present Illness  L quad tendon repair  Clinical Impression  Pt s/p L quad tendon repair presents with decreased L LE strength/ROM and post op pain limiting functional mobility.  Pt demonstrates ability to to mobilize with assist of bledsoe brace and crutches at Mod I level and plans to dc home with family assist.    Follow Up Recommendations No PT follow up    Equipment Recommendations  Crutches    Recommendations for Other Services       Precautions / Restrictions Precautions Precautions: Fall Required Braces or Orthoses: Knee Immobilizer - Left Knee Immobilizer - Left: On at all times Restrictions Weight Bearing Restrictions: No Other Position/Activity Restrictions: WBAT      Mobility  Bed Mobility Overal bed mobility: Modified Independent             General bed mobility comments: cued for technique and saftey awareness  Transfers Overall transfer level: Needs assistance Equipment used: None Transfers: Sit to/from Stand Sit to Stand: Supervision         General transfer comment: cued L LE management and use of UEs to self assist  Ambulation/Gait Ambulation/Gait assistance: Min guard;Supervision;Modified independent (Device/Increase time) Ambulation Distance (Feet): 300 Feet Assistive device: Rolling walker (2 wheeled);Crutches Gait Pattern/deviations: Step-to pattern;Decreased step length - left;Decreased step length - right;Shuffle     General Gait Details: cues for sequence, pace, wt on L heel and position from RW/crutches  Stairs Stairs: Yes Stairs assistance: Min guard Stair Management: No rails Number of Stairs: 3 (single step 3x) General stair comments: cues for sequence, foot/crutch placement and saftey awareness  Wheelchair Mobility    Modified Rankin (Stroke Patients Only)       Balance                                              Pertinent Vitals/Pain Pain Assessment: 0-10 Pain Score: 4  Pain Location: L knee Pain Descriptors / Indicators: Sore Pain Intervention(s): Limited activity within patient's tolerance;Monitored during session;Premedicated before session (pt declined ice pack)    Home Living Family/patient expects to be discharged to:: Private residence Living Arrangements: Spouse/significant other Available Help at Discharge: Family Type of Home: House Home Access: Stairs to enter Entrance Stairs-Rails: None Secretary/administrator of Steps: 1 Home Layout: One level Home Equipment: None      Prior Function Level of Independence: Independent               Hand Dominance        Extremity/Trunk Assessment   Upper Extremity Assessment: Overall WFL for tasks assessed           Lower Extremity Assessment: LLE deficits/detail   LLE Deficits / Details: Bloedsoe brace in place  Cervical / Trunk Assessment: Normal  Communication   Communication: No difficulties  Cognition Arousal/Alertness: Awake/alert Behavior During Therapy: WFL for tasks assessed/performed Overall Cognitive Status: Within Functional Limits for tasks assessed                      General Comments      Exercises        Assessment/Plan    PT Assessment Patent does not need any further PT services  PT Diagnosis Difficulty walking   PT Problem List  PT Treatment Interventions     PT Goals (Current goals can be found in the Care Plan section) Acute Rehab PT Goals Patient Stated Goal: HOME PT Goal Formulation: All assessment and education complete, DC therapy    Frequency     Barriers to discharge        Co-evaluation               End of Session   Activity Tolerance: Patient tolerated treatment well Patient left: in bed;with call bell/phone within reach Nurse Communication: Mobility status    Functional Assessment Tool Used:  clinical judgement Functional Limitation: Mobility: Walking and moving around Mobility: Walking and Moving Around Current Status ([id number](G8978): At least 1 percent but less than 20 percent impaired, limited or restricted Mobility: Walking and Moving Around Goal Status [phone](G8979): At least 1 percent but less than 20 percent impaired, limited or restricted Mobility: Walking and Moving Around Discharge Status [phone](G8980): At least 1 percent but less than 20 percent impaired, limited or restricted    Time: 0847-0915 PT Time Calculation (min) (ACUTE ONLY): 28 min   Charges:   PT Evaluation $Initial PT Evaluation Tier I: 1 Procedure PT Treatments $Gait Training: 8-22 mins   PT G Codes:   PT G-Codes **NOT FOR INPATIENT CLASS** Functional Assessment Tool Used: clinical judgement Functional Limitation: Mobility: Walking and moving around Mobility: Walking and Moving Around Current Status (B1478(G8978): At least 1 percent but less than 20 percent impaired, limited or restricted Mobility: Walking and Moving Around Goal Status [phone](G8979): At least 1 percent but less than 20 percent impaired, limited or restricted Mobility: Walking and Moving Around Discharge Status [phone](G8980): At least 1 percent but less than 20 percent impaired, limited or restricted    Long Island Community HospitalBRADSHAW,Robert Ambrosius 09/23/2014, 12:37 PM

## 2014-09-23 NOTE — Op Note (Signed)
NAME:  HANDY, MCLOUD NO.:  [id number]  MEDICAL RECORD NO.:  [id number]  LOCATION:  1620                         FACILITY:  [facility]  PHYSICIAN:  Jene Every, M.D.    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  PROCEDURES PERFORMED: 1. Quadricepsplasty of the left knee. 2. Debridement of scar tissue, left knee. 3. Quadriceps tendon repair of the left knee utilizing drill holes     through the patella. 4. Biomet patch graft to the quadriceps tendon.  ANESTHESIA:  General.  ASSISTANT:  Lanna Poche, PA.  HISTORY:  This is a pleasant gentleman, 19 who has had a quad tendon tear at work.  Unfortunately, he had a difficult time with workman's compensation getting the treatment approve.  When finally approved is over 3 months status post, he had a full-thickness quad tendon tear rupture, diagnosed by MRI.  He was indicated for repair of possible patch graft.  Risk and benefits were discussed including bleeding, infection, damage to neurovascular structures, no change in symptoms, worsening symptoms, DVT, PE, anesthetic complications, etc.  TECHNIQUE:  With the patient in supine position, after induction of adequate anesthesia, 2 g Kefzol, left lower extremity were prepped and draped in usual sterile fashion.  Midline incision was then made over the knee from the tibial tubercle to 4 cm above the superior pole of the patella.  The subcutaneous tissue was dissected.  Electrocautery was utilized to achieve hemostasis.  Immediately, identified the full- thickness tear in the tendon.  It was at the junction.  There was a small cm area of retained fibrous at the superior pole of the patella that was sporadic.  We debrided that to  the base of the superior pole of patella, and I fashioned a trough in the superior pole of the patella. We debrided the edges of the quadriceps tendon.  Then, we mobilized the quadriceps tendon on top of it and on the undersurface of it, performed a quad plasty.  There were some adhesions to the femur.  Flexed the knee, irrigated seroma, copious antibiotic irrigation.  I then used FiberWire and a Krackow type stitch up through the quadriceps tendon through 3 parallel drill holes in the patella from superior to inferior making small incisions in the patellar ligament and threading the FiberWire through that.  We delivered the quadriceps tendon to the bed provisionally and found just a very slight gap there in the center portion.  We therefore used a Biomet graft patch.  It was reconstituted in the appropriate fashion.  This was then rolled as an anchovy and delivered it into the area of the superior pole of the patella.  I filled then that small gap quite well.  We then reinforced the quadriceps delivery with towel clips, and then sutured with good surgeon's knot distally, taking the centered threads and threading 1 medially and 1 laterally, and tied it with a good surgical knot. Redundant suture removed.  We then oversewed and sewed the patch graft into that region, the superior pole of the patella with #1 Vicryl interrupted figure-of-eight sutures.  For a complete closure and the repair the retinaculum as well, this more intact laterally than medially.  I copiously irrigated the wound again and then subcu with 2-0 and skin with staples.  As we flexed the knee and looked inside the knee, we were unable to gain access to the meniscus and he did have some meniscus pathology.  Sterile dressing applied.  Placed in a knee immobilizer.  Extubated without difficulty, and transported to the recovery room in satisfactory condition.  The patient tolerated the procedure well.  No complications.   Assistant, Lanna PocheJacqueline Bissell, [place]PA.  Blood loss was 100 mL.     Jene EveryJeffrey Alegandra Sommers, M.D.     Cordelia PenJB/MEDQ  D:  09/22/2014  T:  09/23/2014  Job:  [id number]

## 2015-08-08 ENCOUNTER — Ambulatory Visit: Payer: Self-pay | Admitting: Orthopedic Surgery

## 2015-09-15 ENCOUNTER — Ambulatory Visit: Payer: Self-pay | Admitting: Orthopedic Surgery

## 2015-09-15 NOTE — H&P (Signed)
Robert Sweeney is an 55 y.o. male.   Chief Complaint: L knee pain HPI: The patient is a 55 year old male who presents today for follow up of their knee. The patient is being followed for their left quadriceps tendon repair. They are now 7 month(s) (1 1/2 wks) out from surgery (and 10 1/2 months out from injury. DOI 06/21/14). Symptoms reported today include: pain, giving way and instability (weakness). and report their pain level to be mild to moderate (4/10). Current treatment includes: activity modification and pain medications. The following medication has been used for pain control: Percocet. The patient presents today following MRI. Note for "Follow-up Knee": The patient is out of work. NCM Robert Sweeney.  Past Medical History  Diagnosis Date  . Hypertension     under control through diet and exercise  . History of diabetes mellitus, type II     diet controlled-"no medications needed anymore"  . Headache     sometimes    Past Surgical History  Procedure Laterality Date  . No past surgeries    . Quadriceps tendon repair Left 09/22/2014    Procedure: REPAIR LEFT QUADRICEP TENDON with patch graft;  Surgeon: Jene Every, MD;  Location: [facility] ORS;  Service: Orthopedics;  Laterality: Left;    No family history on file. Social History:  reports that he has quit smoking. His smoking use included Cigarettes. He has never used smokeless tobacco. He reports that he drinks alcohol. He reports that he does not use illicit drugs.  Allergies: No Known Allergies   (Not in a hospital admission)  No results found for this or any previous visit (from the past 48 hour(s)). No results found.  Review of Systems  Constitutional: Negative.   HENT: Negative.   Eyes: Negative.   Respiratory: Negative.   Cardiovascular: Negative.   Gastrointestinal: Negative.   Genitourinary: Negative.   Musculoskeletal: Positive for joint pain.  Skin: Negative.   Neurological: Negative.     There were no vitals  taken for this visit. Physical Exam  Constitutional: He appears well-developed.  HENT:  Head: Normocephalic.  Eyes: Pupils are equal, round, and reactive to light.  Neck: Normal range of motion.  Cardiovascular: Normal rate.   Respiratory: Effort normal.  GI: Soft.  Musculoskeletal:  On exam, he is exquisitely tender in the superior pole of the patella. He does have some superficial tenderness. He has a mild to moderate effusion. He is tender in the medial joint line. Equivocal McMurray, 0 to 110.  Knee exam on inspection reveals no evidence of soft tissue swelling, ecchymosis, deformity or erythema. On palpation there is no tenderness in the lateral joint line. No patellofemoral pain with compression. Nontender over the fibular head or the peroneal nerve. Nontender over the quadriceps insertion of the patellar ligament insertion. The range of motion was full. Provocative maneuvers revealed a negative Lachman, negative anterior and posterior drawer. No instability was noted with varus and valgus stressing at 0 or 30 degrees. On manual motor test the quadriceps and hamstrings were 5/5. Sensory exam was intact to light touch.  Neurological: He is alert.  Skin: Skin is warm.    He has had his MRI. He does have a flap tear in his meniscus noted on the MRI. It is in the medial meniscus. He has diffuse tricompartmental osteoarthritis, moderate to severe, involvement of the medial compartment; small Baker cyst. There is a small area of a 2 cm x 1 cm suspicious for a partial tendon avulsion  with granulation tissue and scarring.   Assessment/Plan L knee MMT, quad tear 1. Status post quad tendon repair with apparent small partial re-tear versus a non-healing portion of the distal quadriceps tendon. 2. 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 3. Chondral thinning and lateral facet of the patella.  Extensive discussion with  Robert Sweeney concerning current pathology, relevant anatomy, and treatment options separately in front of the patient with Robert Sweeney, case manager. He is having problems going up and down stairs. He is having persistent pain and swelling within the knee. He likely is symptomatic two fold, one from his small area of the partial re-tear of the quadriceps tendon and his underlying intraarticular pathology, which includes chondromalacia of the medial compartment and a small flap tear of the meniscus. I have offered him options at this point in time are to do nothing versus consideration of injection versus arthroscopic debridement, partial meniscectomy, and mini open augmentation of the previous repair of distal quadriceps tendon. It may require a debridement and side-to-side closure versus insertion of a partial patch graft. Certainly, it would not lead to the extent of his initial surgery. We discussed this as an outpatient to overnight and return to physical therapy and maximum medical improvement six weeks following the procedure. He has given to me a note in terms of his job duties. He is concerned that he will not be able to return to that. If he does lift excess of 50 to 100 pounds, carrying hoses, bending, climbing, twisting, do feel that residual symptoms even following this revision surgery would preclude return to that heavy job classification and most likely would require reassignment. He indicates he is going to school for other objectives at this point. I think that will be reasonable. I have scheduled for that procedure. Certainly, if there is any change in the interim, he is to call and we would anticipate residual symptoms related to his arthrosis. I do feel all this injury and his current symptomatology is stemming from his initial injury. We will schedule him accordingly.  Robert Sweeney, Robert Lodato M., PA-C for Dr. Shelle IronBeane 09/15/2015, 9:26 AM

## 2015-09-29 ENCOUNTER — Encounter (HOSPITAL_COMMUNITY): Payer: Self-pay

## 2015-09-29 ENCOUNTER — Encounter (HOSPITAL_COMMUNITY)
Admission: RE | Admit: 2015-09-29 | Discharge: 2015-09-29 | Disposition: A | Discharge status: Home / Self Care | Payer: Worker's Compensation | Source: Ambulatory Visit | Attending: Specialist | Admitting: Specialist

## 2015-09-29 DIAGNOSIS — E119 Type 2 diabetes mellitus without complications: Secondary | ICD-10-CM | POA: Insufficient documentation

## 2015-09-29 DIAGNOSIS — Z01812 Encounter for preprocedural laboratory examination: Secondary | ICD-10-CM | POA: Diagnosis not present

## 2015-09-29 DIAGNOSIS — Z01810 Encounter for preprocedural cardiovascular examination: Secondary | ICD-10-CM | POA: Diagnosis present

## 2015-09-29 HISTORY — DX: Type 2 diabetes mellitus without complications: E11.9

## 2015-09-29 LAB — CBC
HCT: 43 % (ref 39.0–52.0)
HEMOGLOBIN: 14.2 g/dL (ref 13.0–17.0)
MCH: 30.4 pg (ref 26.0–34.0)
MCHC: 33 g/dL (ref 30.0–36.0)
MCV: 92.1 fL (ref 78.0–100.0)
PLATELETS: 201 10*3/uL (ref 150–400)
RBC: 4.67 MIL/uL (ref 4.22–5.81)
RDW: 12.1 % (ref 11.5–15.5)
WBC: 7.6 10*3/uL (ref 4.0–10.5)

## 2015-09-29 LAB — BASIC METABOLIC PANEL
ANION GAP: 8 (ref 5–15)
BUN: 18 mg/dL (ref 6–20)
CHLORIDE: 99 mmol/L — AB (ref 101–111)
CO2: 29 mmol/L (ref 22–32)
Calcium: 8.9 mg/dL (ref 8.9–10.3)
Creatinine, Ser: 0.74 mg/dL (ref 0.61–1.24)
Glucose, Bld: 327 mg/dL — ABNORMAL HIGH (ref 65–99)
POTASSIUM: 4 mmol/L (ref 3.5–5.1)
SODIUM: 136 mmol/L (ref 135–145)

## 2015-09-29 NOTE — Pre-Procedure Instructions (Addendum)
Pt's wife requests that the patch graft for the meniscus repair be lamb, not swine.  Pt wife took it upon herself to write these instructions on the signed informed consent.  She also requested a copy of the informed consent and to be present in the operating room during her husband's surgery.  I notified pt and wife that per hospital policy we can not hand out copies of signed informed consent, but they can possibly speak with medical records and request a copy. I also told pt's wife that I have never heard of family being allowed in the OR during surgery, but she can speak with Dr. Shelle IronBeane with any further questions.  Pt blood sugar from today's pre-op visit is 327.  Spoke with Dr. Jean RosenthalJackson regarding results.  I called pt's home, cell and work.  I left a voicemail on pt's cell for pt to call me and left number.  Pt's home phone never reached a voicemail.  I have routed the results to Dr. Ermelinda DasBeane's office.  I also spoke with Cordelia PenSherry at Dr. Ermelinda DasBeane's office to let her know.  Pt returned my call. We will wait to see results from HgbA1C and Cordelia PenSherry will touch base with the pt.  Pt EKG stated ischemia.  Spoke with Dr. Jean RosenthalJackson regarding EKG and compared to pt's prior EKG.  Dr. Jean RosenthalJackson said it is okay to move forward with surgery regarding EKG.

## 2015-09-29 NOTE — Patient Instructions (Addendum)
[name] A Grays  09/29/2015   Your procedure is scheduled on: 10-05-15  [facility]  [facility] to 3rd floor to  Short Stay Center at 5:15 AM.  Call this number if you have problems the morning of surgery [phone]   Remember: ONLY 1 PERSON MAY GO WITH YOU TO SHORT STAY TO GET  READY MORNING OF YOUR SURGERY.  Do not eat food or drink liquids :After Midnight.     Take these medicines the morning of surgery with A SIP OF WATER: Percocet if needed. DO NOT TAKE ANY DIABETIC MEDICATIONS DAY OF YOUR SURGERY                               You may not have any metal on your body including hair pins and              piercings  Do not wear jewelry, make-up, lotions, powders or perfumes, deodorant             Do not wear nail polish.  Do not shave  48 hours prior to surgery.              Men may shave face and neck.   Do not bring valuables to the hospital. [place] IS NOT             RESPONSIBLE   FOR VALUABLES.  Contacts, dentures or bridgework may not be worn into surgery.  Leave suitcase in the car. After surgery it may be brought to your room.     Patients discharged the day of surgery will not be allowed to drive home.  Name and phone number of your driver:                Please read over the following fact sheets you were given: _____________________________________________________________________             [facility] - Preparing for Surgery Before surgery, you can play an important role.  Because skin is not sterile, your skin needs to be as free of germs as possible.  You can reduce the number of germs on your skin by washing with CHG (chlorahexidine gluconate) soap before surgery.  CHG is an antiseptic cleaner which kills germs and bonds with the skin to continue killing germs even after washing. Please DO NOT use if you have an allergy to CHG or antibacterial soaps.  If your skin becomes reddened/irritated stop using  the CHG and inform your nurse when you arrive at Short Stay. Do not shave (including legs and underarms) for at least 48 hours prior to the first CHG shower.  You may shave your face/neck. Please follow these instructions carefully:  1.  Shower with CHG Soap the night before surgery and the  morning of Surgery.  2.  If you choose to wash your hair, wash your hair first as usual with your  normal  shampoo.  3.  After you shampoo, rinse your hair and body thoroughly to remove the  shampoo.                           4.  Use CHG as you would any other liquid soap.  You can apply chg directly  to the skin and wash  Gently with a scrungie or clean washcloth.  5.  Apply the CHG Soap to your body ONLY FROM THE NECK DOWN.   Do not use on face/ open                           Wound or open sores. Avoid contact with eyes, ears mouth and genitals (private parts).                       Wash face,  Genitals (private parts) with your normal soap.             6.  Wash thoroughly, paying special attention to the area where your surgery  will be performed.  7.  Thoroughly rinse your body with warm water from the neck down.  8.  DO NOT shower/wash with your normal soap after using and rinsing off  the CHG Soap.                9.  Pat yourself dry with a clean towel.            10.  Wear clean pajamas.            11.  Place clean sheets on your bed the night of your first shower and do not  sleep with pets. Day of Surgery : Do not apply any lotions/deodorants the morning of surgery.  Please wear clean clothes to the hospital/surgery center.  FAILURE TO FOLLOW THESE INSTRUCTIONS MAY RESULT IN THE CANCELLATION OF YOUR SURGERY PATIENT SIGNATURE_________________________________  NURSE SIGNATURE__________________________________  ________________________________________________________________________   Adam Phenix  An incentive spirometer is a tool that can help keep your lungs  clear and active. This tool measures how well you are filling your lungs with each breath. Taking long deep breaths may help reverse or decrease the chance of developing breathing (pulmonary) problems (especially infection) following:  A long period of time when you are unable to move or be active. BEFORE THE PROCEDURE   If the spirometer includes an indicator to show your best effort, your nurse or respiratory therapist will set it to a desired goal.  If possible, sit up straight or lean slightly forward. Try not to slouch.  Hold the incentive spirometer in an upright position. INSTRUCTIONS FOR USE  1. Sit on the edge of your bed if possible, or sit up as far as you can in bed or on a chair. 2. Hold the incentive spirometer in an upright position. 3. Breathe out normally. 4. Place the mouthpiece in your mouth and seal your lips tightly around it. 5. Breathe in slowly and as deeply as possible, raising the piston or the ball toward the top of the column. 6. Hold your breath for 3-5 seconds or for as long as possible. Allow the piston or ball to fall to the bottom of the column. 7. Remove the mouthpiece from your mouth and breathe out normally. 8. Rest for a few seconds and repeat Steps 1 through 7 at least 10 times every 1-2 hours when you are awake. Take your time and take a few normal breaths between deep breaths. 9. The spirometer may include an indicator to show your best effort. Use the indicator as a goal to work toward during each repetition. 10. After each set of 10 deep breaths, practice coughing to be sure your lungs are clear. If you have an incision (the cut made at the time of surgery),  support your incision when coughing by placing a pillow or rolled up towels firmly against it. Once you are able to get out of bed, walk around indoors and cough well. You may stop using the incentive spirometer when instructed by your caregiver.  RISKS AND COMPLICATIONS  Take your time so you do  not get dizzy or light-headed.  If you are in pain, you may need to take or ask for pain medication before doing incentive spirometry. It is harder to take a deep breath if you are having pain. AFTER USE  Rest and breathe slowly and easily.  It can be helpful to keep track of a log of your progress. Your caregiver can provide you with a simple table to help with this. If you are using the spirometer at home, follow these instructions: [place] IF:   You are having difficultly using the spirometer.  You have trouble using the spirometer as often as instructed.  Your pain medication is not giving enough relief while using the spirometer.  You develop fever of 100.5 F (38.1 C) or higher. SEEK IMMEDIATE MEDICAL CARE IF:   You cough up bloody sputum that had not been present before.  You develop fever of 102 F (38.9 C) or greater.  You develop worsening pain at or near the incision site. MAKE SURE YOU:   Understand these instructions.  Will watch your condition.  Will get help right away if you are not doing well or get worse. Document Released: 11/04/2006 Document Revised: 09/16/2011 Document Reviewed: 01/05/2007 [employer] Patient Information 2014 [place], [place].   ________________________________________________________________________

## 2015-09-30 LAB — HEMOGLOBIN A1C
Hgb A1c MFr Bld: 10.4 % — ABNORMAL HIGH (ref 4.8–5.6)
Mean Plasma Glucose: 252 mg/dL

## 2015-10-02 NOTE — Progress Notes (Signed)
HgA1C done 09/29/15 routed via EPIC to Dr Shelle IronBeane. Final EKG in EPIC done 09/29/15.

## 2015-10-05 ENCOUNTER — Encounter (HOSPITAL_COMMUNITY): Admission: RE | Payer: Self-pay | Source: Ambulatory Visit

## 2015-10-05 ENCOUNTER — Ambulatory Visit (HOSPITAL_COMMUNITY): Admission: RE | Admit: 2015-10-05 | Payer: Worker's Compensation | Source: Ambulatory Visit | Admitting: Specialist

## 2015-10-05 SURGERY — ARTHROSCOPY, KNEE, WITH MENISCUS REPAIR
Anesthesia: General | Laterality: Left

## 2015-11-07 ENCOUNTER — Ambulatory Visit (INDEPENDENT_AMBULATORY_CARE_PROVIDER_SITE_OTHER): Payer: Worker's Compensation | Admitting: Emergency Medicine

## 2015-11-07 VITALS — BP 152/80 | HR 66 | Temp 98.1°F | Resp 18 | Ht 77.0 in | Wt 264.2 lb

## 2015-11-07 DIAGNOSIS — Z419 Encounter for procedure for purposes other than remedying health state, unspecified: Secondary | ICD-10-CM | POA: Diagnosis not present

## 2015-11-07 DIAGNOSIS — R9431 Abnormal electrocardiogram [ECG] [EKG]: Secondary | ICD-10-CM

## 2015-11-07 DIAGNOSIS — E119 Type 2 diabetes mellitus without complications: Secondary | ICD-10-CM | POA: Insufficient documentation

## 2015-11-07 DIAGNOSIS — Z1159 Encounter for screening for other viral diseases: Secondary | ICD-10-CM | POA: Diagnosis not present

## 2015-11-07 DIAGNOSIS — I10 Essential (primary) hypertension: Secondary | ICD-10-CM | POA: Diagnosis not present

## 2015-11-07 DIAGNOSIS — Z114 Encounter for screening for human immunodeficiency virus [HIV]: Secondary | ICD-10-CM

## 2015-11-07 DIAGNOSIS — Z125 Encounter for screening for malignant neoplasm of prostate: Secondary | ICD-10-CM | POA: Diagnosis not present

## 2015-11-07 DIAGNOSIS — Z0000 Encounter for general adult medical examination without abnormal findings: Secondary | ICD-10-CM | POA: Diagnosis not present

## 2015-11-07 LAB — HEPATITIS C ANTIBODY: HCV Ab: NEGATIVE

## 2015-11-07 LAB — LIPID PANEL
CHOL/HDL RATIO: 4.3 ratio (ref <=5.0)
Cholesterol: 155 mg/dL (ref 125–200)
HDL: 36 mg/dL — ABNORMAL LOW (ref >=40)
LDL Cholesterol: 107 mg/dL (ref <130)
Triglycerides: 59 mg/dL (ref <150)
VLDL: 12 mg/dL (ref <30)

## 2015-11-07 LAB — COMPLETE METABOLIC PANEL WITH GFR
ALT: 20 U/L (ref 9–46)
AST: 16 U/L (ref 10–35)
Albumin: 4.1 g/dL (ref 3.6–5.1)
Alkaline Phosphatase: 141 U/L — ABNORMAL HIGH (ref 40–115)
BILIRUBIN TOTAL: 1 mg/dL (ref 0.2–1.2)
BUN: 13 mg/dL (ref 7–25)
CALCIUM: 9.1 mg/dL (ref 8.6–10.3)
CO2: 29 mmol/L (ref 20–31)
CREATININE: 0.92 mg/dL (ref 0.70–1.33)
Chloride: 98 mmol/L (ref 98–110)
GFR, Est Non African American: >89 mL/min (ref >=60)
Glucose, Bld: 326 mg/dL — ABNORMAL HIGH (ref 65–99)
Potassium: 3.9 mmol/L (ref 3.5–5.3)
Sodium: 137 mmol/L (ref 135–146)
TOTAL PROTEIN: 7 g/dL (ref 6.1–8.1)

## 2015-11-07 LAB — POCT CBC
GRANULOCYTE PERCENT: 48.4 % (ref 37–80)
HCT, POC: 45 % (ref 43.5–53.7)
Hemoglobin: 15.7 g/dL (ref 14.1–18.1)
Lymph, poc: 3.7 — AB (ref 0.6–3.4)
MCH: 31.5 pg — AB (ref 27–31.2)
MCHC: 35 g/dL (ref 31.8–35.4)
MCV: 90.1 fL (ref 80–97)
MID (cbc): 0.1 (ref 0–0.9)
MPV: 8.9 fL (ref 0–99.8)
POC GRANULOCYTE: 3.5 (ref 2–6.9)
POC LYMPH PERCENT: 50.2 %L — AB (ref 10–50)
POC MID %: 1.4 % (ref 0–12)
Platelet Count, POC: 169 10*3/uL (ref 142–424)
RBC: 5 M/uL (ref 4.69–6.13)
RDW, POC: 12.4 %
WBC: 7.3 10*3/uL (ref 4.6–10.2)

## 2015-11-07 LAB — HIV ANTIBODY (ROUTINE TESTING W REFLEX): HIV: NONREACTIVE

## 2015-11-07 LAB — MICROALBUMIN, URINE: MICROALB UR: 2.4 mg/dL

## 2015-11-07 LAB — GLUCOSE, POCT (MANUAL RESULT ENTRY): POC Glucose: 342 mg/dl — AB (ref 70–99)

## 2015-11-07 LAB — POCT GLYCOSYLATED HEMOGLOBIN (HGB A1C): Hemoglobin A1C: 10.9

## 2015-11-07 LAB — TSH: TSH: 1.08 m[IU]/L (ref 0.40–4.50)

## 2015-11-07 MED ORDER — ATORVASTATIN CALCIUM 20 MG PO TABS: 20.0000 mg | ORAL_TABLET | Freq: Every day | ORAL | Status: DC

## 2015-11-07 MED ORDER — LOSARTAN POTASSIUM 50 MG PO TABS: 50.0000 mg | ORAL_TABLET | Freq: Every day | ORAL | Status: DC

## 2015-11-07 MED ORDER — METFORMIN HCL 500 MG PO TABS: 500.0000 mg | ORAL_TABLET | Freq: Two times a day (BID) | ORAL | Status: DC

## 2015-11-07 NOTE — Progress Notes (Deleted)
By signing my name below, I, Raven Small, attest that this documentation has been prepared under the direction and in the presence of Lesle ChrisSteven Daub, MD.  Electronically Signed: Andrew Auaven Small, ED Scribe. 11/07/2015. 10:33 AM.  Chief Complaint:  Chief Complaint  Patient presents with   Medical Clearance    says his diabetes is not controlled   knee surgery    says he needs surgery on his left knee   Case manager is in lobby if you need to speak to her    HPI: Robert Sweeney is a 55 y.o. male who [facility]. Pt has hx of left knee surgery due to a WC injury in possibly January or February 2016. Pt injured his leg while working on a furnace underneath a house. He had left knee surgery 09/22/14 by Dr. Shelle IronBeane. He states due to left knee complications he is needing an additional surgery to left knee by Dr. Shelle IronBeane. He was schedule to have surgery 3/30 but due to elevated blood sugar surgery was cancelled. 3/24-Glucose 327, A1C 10.4. Pt has hx of DM. States sugars had been controlled with diet and exercise and has not been on medication in while. During initially surgery 09/22/14 sugars were 271.   Pt takes oxycodone and a baby aspirin occasionally.    Past Medical History  Diagnosis Date   Hypertension     under control through diet and exercise   History of diabetes mellitus, type II     diet controlled-"no medications needed anymore"   Headache     sometimes   Diabetes mellitus without complication (HCC)     type 2   Past Surgical History  Procedure Laterality Date   No past surgeries     Quadriceps tendon repair Left 09/22/2014    Procedure: REPAIR LEFT QUADRICEP TENDON with patch graft;  Surgeon: [name], MD;  Location: [facility];  Service: Orthopedics;  Laterality: Left;   Social History   Social History   Marital Status: Married    Spouse Name: N/A   Number of Children: N/A   Years of Education: N/A   Social History Main Topics    Smoking status: Former Smoker    Types: Cigarettes    Quit date: 09/28/1981   Smokeless tobacco: Never Used     Comment: some 40 years ago   Alcohol Use: Yes     Comment: rare   Drug Use: No   Sexual Activity: Not Asked   Other Topics Concern   None   Social History Narrative   History reviewed. No pertinent family history. No Known Allergies Prior to Admission medications   Medication Sig Start Date End Date Taking? Authorizing Provider  aspirin EC 325 MG tablet Take 325 mg by mouth daily. Reported on 11/07/2015    Historical Provider, MD  b complex vitamins tablet Take 1 tablet by mouth daily. Reported on 11/07/2015    Historical Provider, MD  BEE POLLEN PO Take 1 capsule by mouth daily. Reported on 11/07/2015    Historical Provider, MD  Cinnamon 500 MG TABS Take 1 tablet by mouth daily. Reported on 11/07/2015    Historical Provider, MD  Coenzyme Q10 (CO Q 10 PO) Take 1 capsule by mouth daily. Reported on 11/07/2015    Historical Provider, MD  docusate sodium (COLACE) 100 MG capsule Take 1 capsule (100 mg total) by mouth 2 (two) times daily as needed for mild constipation. Patient not taking: Reported on 09/25/2015 09/22/14  Jene Every, MD  methocarbamol (ROBAXIN) 500 MG tablet Take 1 tablet (500 mg total) by mouth 3 (three) times daily between meals as needed for muscle spasms. Patient not taking: Reported on 09/25/2015 09/22/14   Jene Every, MD  Multiple Vitamin (MULTIVITAMIN WITH MINERALS) TABS tablet Take 1 tablet by mouth every morning. Reported on 11/07/2015    Historical Provider, MD  Nutritional Supplements (DHEA PO) Take 1 capsule by mouth daily. Reported on 11/07/2015    Historical Provider, MD  OVER THE COUNTER MEDICATION Take 1 tablet by mouth daily. Reported on 11/07/2015    Historical Provider, MD  OVER THE COUNTER MEDICATION Take 1 tablet by mouth daily. Reported on 11/07/2015    Historical Provider, MD  oxyCODONE-acetaminophen (PERCOCET) 5-325 MG per tablet Take 1 tablet by  mouth every 4 (four) hours as needed. Patient not taking: Reported on 11/07/2015 09/22/14   Jene Every, MD  zinc gluconate 50 MG tablet Take 50 mg by mouth daily. Reported on 11/07/2015    Historical Provider, MD     ROS: The patient denies fevers, chills, night sweats, unintentional weight loss, chest pain, palpitations, wheezing, dyspnea on exertion, nausea, vomiting, abdominal pain, dysuria, hematuria, melena, numbness, weakness, or tingling.   All other systems have been reviewed and were otherwise negative with the exception of those mentioned in the HPI and as above.    PHYSICAL EXAM: Filed Vitals:   11/07/15 1017  BP: 152/80  Pulse: 66  Temp: 98.1 F (36.7 C)  Resp: 18   Body mass index is 31.32 kg/(m^2).   General: Alert, no acute distress HEENT:  Normocephalic, atraumatic, oropharynx patent. Eye: Nonie Hoyer [facility] Cardiovascular:  Regular rate and rhythm, no rubs murmurs or gallops.  No Carotid bruits, radial pulse intact. No pedal edema.  Respiratory: Clear to auscultation bilaterally.  No wheezes, rales, or rhonchi.  No cyanosis, no use of accessory musculature Abdominal: No organomegaly, abdomen is soft and non-tender, positive bowel sounds.  No masses. Musculoskeletal: Gait intact. No edema, tenderness Skin: No rashes. Neurologic: Facial musculature symmetric. Psychiatric: Patient acts appropriately throughout our interaction. Lymphatic: No cervical or submandibular lymphadenopathy    LABS: Results for orders placed or performed in visit on 11/07/15  POCT CBC  Result Value Ref Range   WBC 7.3 4.6 - 10.2 K/uL   Lymph, poc 3.7 (A) 0.6 - 3.4   POC LYMPH PERCENT 50.2 (A) 10 - 50 %L   MID (cbc) 0.1 0 - 0.9   POC MID % 1.4 0 - 12 %M   POC Granulocyte 3.5 2 - 6.9   Granulocyte percent 48.4 37 - 80 %G   RBC 5.00 4.69 - 6.13 M/uL   Hemoglobin 15.7 14.1 - 18.1 g/dL   HCT, POC 16.1 09.6 - 53.7 %   MCV 90.1 80 - 97 fL   MCH, POC 31.5 (A) 27 - 31.2 pg   MCHC 35.0 31.8 -  35.4 g/dL   RDW, POC 04.5 %   Platelet Count, POC 169 142 - 424 K/uL   MPV 8.9 0 - 99.8 fL  POCT glucose (manual entry)  Result Value Ref Range   POC Glucose 342 (A) 70 - 99 mg/dl  POCT glycosylated hemoglobin (Hb A1C)  Result Value Ref Range   Hemoglobin A1C 10.9    Meds ordered this encounter  Medications   metFORMIN (GLUCOPHAGE) 500 MG tablet    Sig: Take 1 tablet (500 mg total) by mouth 2 (two) times daily with a meal.    Dispense:  60 tablet    Refill:  11   losartan (COZAAR) 50 MG tablet    Sig: Take 1 tablet (50 mg total) by mouth daily.    Dispense:  30 tablet    Refill:  11   atorvastatin (LIPITOR) 20 MG tablet    Sig: Take 1 tablet (20 mg total) by mouth daily.    Dispense:  30 tablet    Refill:  11    EKG/XRAY:   EKG- pt has  Twave changes V3- V6 1-2 AVL and AVF  ASSESSMENT/PLAN: We'll start baby aspirin one a day. We'll start losartan 50 one a day. We'll start Lipitor 10 mg 1 a day. Referral made to cardiology. Recheck 1 month.I personally performed the services described in this documentation, which was scribed in my presence. The recorded information has been reviewed and is accurate.   Gross sideeffects, risk and benefits, and alternatives of medications d/w patient. Patient is aware that all medications have potential sideeffects and we are unable to predict every sideeffect or drug-drug interaction that may occur.  Lesle Chris MD 11/07/2015 10:20 AM

## 2015-11-07 NOTE — Patient Instructions (Addendum)
   IF you received an x-ray today, you will receive an invoice from [facility]. Please contact [facility] at [phone] with questions or concerns regarding your invoice.   IF you received labwork today, you will receive an invoice from [facility]. Please contact [facility] at [phone] with questions or concerns regarding your invoice.   Our billing staff will not be able to assist you with questions regarding bills from these companies.  You will be contacted with the lab results as soon as they are available. The fastest way to get your results is to activate your My Chart account. Instructions are located on the last page of this paperwork. If you have not heard from us regarding the results in 2 weeks, please contact this office.     DASH Eating Plan DASH stands for "Dietary Approaches to Stop Hypertension." The DASH eating plan is a healthy eating plan that has been shown to reduce high blood pressure (hypertension). Additional health benefits may include reducing the risk of type 2 diabetes mellitus, heart disease, and stroke. The DASH eating plan may also help with weight loss. WHAT DO I NEED TO KNOW ABOUT THE DASH EATING PLAN? For the DASH eating plan, you will follow these general guidelines:  Choose foods with a percent daily value for sodium of less than 5% (as listed on the food label).  Use salt-free seasonings or herbs instead of table salt or sea salt.  Check with your health care provider or pharmacist before using salt substitutes.  Eat lower-sodium products, often labeled as "lower sodium" or "no salt added."  Eat fresh foods.  Eat more vegetables, fruits, and low-fat dairy products.  Choose whole grains. Look for the word "whole" as the first word in the ingredient list.  Choose fish and skinless chicken or turkey more often than red meat. Limit fish, poultry, and meat to 6 oz (170 g) each day.  Limit sweets,  desserts, sugars, and sugary drinks.  Choose heart-healthy fats.  Limit cheese to 1 oz (28 g) per day.  Eat more home-cooked food and less restaurant, buffet, and fast food.  Limit fried foods.  Cook foods using methods other than frying.  Limit canned vegetables. If you do use them, rinse them well to decrease the sodium.  When eating at a restaurant, ask that your food be prepared with less salt, or no salt if possible. WHAT FOODS CAN I EAT? Seek help from a dietitian for individual calorie needs. Grains Whole grain or whole wheat bread. Brown rice. Whole grain or whole wheat pasta. Quinoa, bulgur, and whole grain cereals. Low-sodium cereals. Corn or whole wheat flour tortillas. Whole grain cornbread. Whole grain crackers. Low-sodium crackers. Vegetables Fresh or frozen vegetables (raw, steamed, roasted, or grilled). Low-sodium or reduced-sodium tomato and vegetable juices. Low-sodium or reduced-sodium tomato sauce and paste. Low-sodium or reduced-sodium canned vegetables.  Fruits All fresh, canned (in natural juice), or frozen fruits. Meat and Other Protein Products Ground beef (85% or leaner), grass-fed beef, or beef trimmed of fat. Skinless chicken or turkey. Ground chicken or turkey. Pork trimmed of fat. All fish and seafood. Eggs. Dried beans, peas, or lentils. Unsalted nuts and seeds. Unsalted canned beans. Dairy Low-fat dairy products, such as skim or 1% milk, 2% or reduced-fat cheeses, low-fat ricotta or cottage cheese, or plain low-fat yogurt. Low-sodium or reduced-sodium cheeses. Fats and Oils Tub margarines without trans fats. Light or reduced-fat mayonnaise and salad dressings (reduced sodium). Avocado. Safflower, olive, or canola   oils. Natural peanut or almond butter. Other Unsalted popcorn and pretzels. The items listed above may not be a complete list of recommended foods or beverages. Contact your dietitian for more options. WHAT FOODS ARE NOT  RECOMMENDED? Grains White bread. White pasta. White rice. Refined cornbread. Bagels and croissants. Crackers that contain trans fat. Vegetables Creamed or fried vegetables. Vegetables in a cheese sauce. Regular canned vegetables. Regular canned tomato sauce and paste. Regular tomato and vegetable juices. Fruits Dried fruits. Canned fruit in light or heavy syrup. Fruit juice. Meat and Other Protein Products Fatty cuts of meat. Ribs, chicken wings, bacon, sausage, bologna, salami, chitterlings, fatback, hot dogs, bratwurst, and packaged luncheon meats. Salted nuts and seeds. Canned beans with salt. Dairy Whole or 2% milk, cream, half-and-half, and cream cheese. Whole-fat or sweetened yogurt. Full-fat cheeses or blue cheese. Nondairy creamers and whipped toppings. Processed cheese, cheese spreads, or cheese curds. Condiments Onion and garlic salt, seasoned salt, table salt, and sea salt. Canned and packaged gravies. Worcestershire sauce. Tartar sauce. Barbecue sauce. Teriyaki sauce. Soy sauce, including reduced sodium. Steak sauce. Fish sauce. Oyster sauce. Cocktail sauce. Horseradish. Ketchup and mustard. Meat flavorings and tenderizers. Bouillon cubes. Hot sauce. Tabasco sauce. Marinades. Taco seasonings. Relishes. Fats and Oils Butter, stick margarine, lard, shortening, ghee, and bacon fat. Coconut, palm kernel, or palm oils. Regular salad dressings. Other Pickles and olives. Salted popcorn and pretzels. The items listed above may not be a complete list of foods and beverages to avoid. Contact your dietitian for more information. WHERE CAN I FIND MORE INFORMATION? National Heart, Lung, and Blood Institute: CablePromo.itwww.nhlbi.nih.gov/health/health-topics/topics/dash/   This information is not intended to replace advice given to you by your health care provider. Make sure you discuss any questions you have with your health care provider.   Document Released: 06/13/2011 Document Revised: 07/15/2014  Document Reviewed: 04/28/2013 Elsevier Interactive Patient Education 2016 Elsevier Inc. Type 2 Diabetes Mellitus, Adult Type 2 diabetes mellitus, often simply referred to as type 2 diabetes, is a long-lasting (chronic) disease. In type 2 diabetes, the pancreas does not make enough insulin (a hormone), the cells are less responsive to the insulin that is made (insulin resistance), or both. Normally, insulin moves sugars from food into the tissue cells. The tissue cells use the sugars for energy. The lack of insulin or the lack of normal response to insulin causes excess sugars to build up in the blood instead of going into the tissue cells. As a result, high blood sugar (hyperglycemia) develops. The effect of high sugar (glucose) levels can cause many complications. Type 2 diabetes was also previously called adult-onset diabetes, but it can occur at any age.  RISK FACTORS  A person is predisposed to developing type 2 diabetes if someone in the family has the disease and also has one or more of the following primary risk factors:  Weight gain, or being overweight or obese.  An inactive lifestyle.  A history of consistently eating high-calorie foods. Maintaining a normal weight and regular physical activity can reduce the chance of developing type 2 diabetes. SYMPTOMS  A person with type 2 diabetes may not show symptoms initially. The symptoms of type 2 diabetes appear slowly. The symptoms include:  Increased thirst (polydipsia).  Increased urination (polyuria).  Increased urination during the night (nocturia).  Sudden or unexplained weight changes.  Frequent, recurring infections.  Tiredness (fatigue).  Weakness.  Vision changes, such as blurred vision.  Fruity smell to your breath.  Abdominal pain.  Nausea or vomiting.  Cuts or bruises which are slow to heal.  Tingling or numbness in the hands or feet.  An open skin wound (ulcer). DIAGNOSIS Type 2 diabetes is  frequently not diagnosed until complications of diabetes are present. Type 2 diabetes is diagnosed when symptoms or complications are present and when blood glucose levels are increased. Your blood glucose level may be checked by one or more of the following blood tests:  A fasting blood glucose test. You will not be allowed to eat for at least 8 hours before a blood sample is taken.  A random blood glucose test. Your blood glucose is checked at any time of the day regardless of when you ate.  A hemoglobin A1c blood glucose test. A hemoglobin A1c test provides information about blood glucose control over the previous 3 months.  An oral glucose tolerance test (OGTT). Your blood glucose is measured after you have not eaten (fasted) for 2 hours and then after you drink a glucose-containing beverage. TREATMENT   You may need to take insulin or diabetes medicine daily to keep blood glucose levels in the desired range.  If you use insulin, you may need to adjust the dosage depending on the carbohydrates that you eat with each meal or snack.  Lifestyle changes are recommended as part of your treatment. These may include:  Following an individualized diet plan developed by a nutritionist or dietitian.  Exercising daily. Your health care providers will set individualized treatment goals for you based on your age, your medicines, how long you have had diabetes, and any other medical conditions you have. Generally, the goal of treatment is to maintain the following blood glucose levels:  Before meals (preprandial): 80-130 mg/dL.  After meals (postprandial): below 180 mg/dL.  A1c: less than 6.5-7%. HOME CARE INSTRUCTIONS   Have your hemoglobin A1c level checked twice a year.  Perform daily blood glucose monitoring as directed by your health care provider.  Monitor urine ketones when you are ill and as directed by your health care provider.  Take your diabetes medicine or insulin as directed by  your health care provider to maintain your blood glucose levels in the desired range.  Never run out of diabetes medicine or insulin. It is needed every day.  If you are using insulin, you may need to adjust the amount of insulin given based on your intake of carbohydrates. Carbohydrates can raise blood glucose levels but need to be included in your diet. Carbohydrates provide vitamins, minerals, and fiber which are an essential part of a healthy diet. Carbohydrates are found in fruits, vegetables, whole grains, dairy products, legumes, and foods containing added sugars.  Eat healthy foods. You should make an appointment to see a registered dietitian to help you create an eating plan that is right for you.  Lose weight if you are overweight.  Carry a medical alert card or wear your medical alert jewelry.  Carry a 15-gram carbohydrate snack with you at all times to treat low blood glucose (hypoglycemia). Some examples of 15-gram carbohydrate snacks include:  Glucose tablets, 3 or 4.  Glucose gel, 15-gram tube.  Raisins, 2 tablespoons (24 grams).  Jelly beans, 6.  Animal crackers, 8.  Regular pop, 4 ounces (120 mL).  Gummy treats, 9.  Recognize hypoglycemia. Hypoglycemia occurs with blood glucose levels of 70 mg/dL and below. The risk for hypoglycemia increases when fasting or skipping meals, during or after intense exercise, and during sleep. Hypoglycemia symptoms can include:  Tremors or shakes.  Decreased  ability to concentrate.  Sweating.  Increased heart rate.  Headache.  Dry mouth.  Hunger.  Irritability.  Anxiety.  Restless sleep.  Altered speech or coordination.  Confusion.  Treat hypoglycemia promptly. If you are alert and able to safely swallow, follow the 15:15 rule:  Take 15-20 grams of rapid-acting glucose or carbohydrate. Rapid-acting options include glucose gel, glucose tablets, or 4 ounces (120 mL) of fruit juice, regular soda, or low-fat  milk.  Check your blood glucose level 15 minutes after taking the glucose.  Take 15-20 grams more of glucose if the repeat blood glucose level is still 70 mg/dL or below.  Eat a meal or snack within 1 hour once blood glucose levels return to normal.  Be alert to feeling very thirsty and urinating more frequently than usual, which are early signs of hyperglycemia. An early awareness of hyperglycemia allows for prompt treatment. Treat hyperglycemia as directed by your health care provider.  Engage in at least 150 minutes of moderate-intensity physical activity a week, spread over at least 3 days of the week or as directed by your health care provider. In addition, you should engage in resistance exercise at least 2 times a week or as directed by your health care provider. Try to spend no more than 90 minutes at one time inactive.  Adjust your medicine and food intake as needed if you start a new exercise or sport.  Follow your sick-day plan anytime you are unable to eat or drink as usual.  Do not use any tobacco products including cigarettes, chewing tobacco, or electronic cigarettes. If you need help quitting, ask your health care provider.  Limit alcohol intake to no more than 1 drink per day for nonpregnant women and 2 drinks per day for men. You should drink alcohol only when you are also eating food. Talk with your health care provider whether alcohol is safe for you. Tell your health care provider if you drink alcohol several times a week.  Keep all follow-up visits as directed by your health care provider. This is important.  Schedule an eye exam soon after the diagnosis of type 2 diabetes and then annually.  Perform daily skin and foot care. Examine your skin and feet daily for cuts, bruises, redness, nail problems, bleeding, blisters, or sores. A foot exam by a health care provider should be done annually.  Brush your teeth and gums at least twice a day and floss at least once a day.  Follow up with your dentist regularly.  Share your diabetes management plan with your workplace or school.  Keep your immunizations up to date. It is recommended that you receive a flu (influenza) vaccine every year. It is also recommended that you receive a pneumonia (pneumococcal) vaccine. If you are 50 years of age or older and have never received a pneumonia vaccine, this vaccine may be given as a series of two separate shots. Ask your health care provider which additional vaccines may be recommended.  Learn to manage stress.  Obtain ongoing diabetes education and support as needed.  Participate in or seek rehabilitation as needed to maintain or improve independence and quality of life. Request a physical or occupational therapy referral if you are having foot or hand numbness, or difficulties with grooming, dressing, eating, or physical activity. SEEK MEDICAL CARE IF:   You are unable to eat food or drink fluids for more than 6 hours.  You have nausea and vomiting for more than 6 hours.  Your blood glucose  level is over 240 mg/dL.  There is a change in mental status.  You develop an additional serious illness.  You have diarrhea for more than 6 hours.  You have been sick or have had a fever for a couple of days and are not getting better.  You have pain during any physical activity.  SEEK IMMEDIATE MEDICAL CARE IF:  You have difficulty breathing.  You have moderate to large ketone levels.   This information is not intended to replace advice given to you by your health care provider. Make sure you discuss any questions you have with your health care provider.   Document Released: 06/24/2005 Document Revised: 03/15/2015 Document Reviewed: 01/21/2012 Elsevier Interactive Patient Education [employer]

## 2015-11-07 NOTE — Progress Notes (Signed)
By signing my name below, I, Raven Small, attest that this documentation has been prepared under the direction and in the presence of Lesle Chris, MD.  Electronically Signed: Andrew Au, ED Scribe. 11/07/2015. 10:33 AM.  Chief Complaint:  Chief Complaint  Patient presents with  . Medical Clearance    says his diabetes is not controlled  . knee surgery    says he needs surgery on his left knee  . Case manager is in lobby if you need to speak to her    HPI: Robert Sweeney is a 55 y.o. male who reports to [facility] today for medical clearance. Pt has hx of left knee surgery due to a WC injury in possibly January or February 2016. Pt injured his leg while working on a furnace underneath a house. He had left knee surgery 09/22/14 by Dr. Shelle Iron. He states due to left knee complications he is needing an additional surgery to left knee by Dr. Shelle Iron. He was schedule to have surgery 3/30 but due to elevated blood sugar surgery was cancelled. 3/24-Glucose 327, A1C 10.4. Pt has hx of DM. States sugars had been controlled with diet and exercise and has not been on medication in while. During initially surgery 09/22/14 sugars were 271.   Pt takes oxycodone and a baby aspirin occasionally.    Past Medical History  Diagnosis Date  . Hypertension     under control through diet and exercise  . History of diabetes mellitus, type II     diet controlled-"no medications needed anymore"  . Headache     sometimes  . Diabetes mellitus without complication (HCC)     type 2   Past Surgical History  Procedure Laterality Date  . No past surgeries    . Quadriceps tendon repair Left 09/22/2014    Procedure: REPAIR LEFT QUADRICEP TENDON with patch graft;  Surgeon: Jene Every, MD;  Location: [facility];  Service: Orthopedics;  Laterality: Left;   Social History   Social History  . Marital Status: Married    Spouse Name: N/A  . Number of Children: N/A  . Years of Education: N/A   Social History Main Topics  .  Smoking status: Former Smoker    Types: Cigarettes    Quit date: 09/28/1981  . Smokeless tobacco: Never Used     Comment: some 40 years ago  . Alcohol Use: Yes     Comment: rare  . Drug Use: No  . Sexual Activity: Not Asked   Other Topics Concern  . None   Social History Narrative   History reviewed. No pertinent family history. No Known Allergies Prior to Admission medications   Medication Sig Start Date End Date Taking? Authorizing Provider  aspirin EC 325 MG tablet Take 325 mg by mouth daily. Reported on 11/07/2015    Historical Provider, MD  b complex vitamins tablet Take 1 tablet by mouth daily. Reported on 11/07/2015    Historical Provider, MD  BEE POLLEN PO Take 1 capsule by mouth daily. Reported on 11/07/2015    Historical Provider, MD  Cinnamon 500 MG TABS Take 1 tablet by mouth daily. Reported on 11/07/2015    Historical Provider, MD  Coenzyme Q10 (CO Q 10 PO) Take 1 capsule by mouth daily. Reported on 11/07/2015    Historical Provider, MD  docusate sodium (COLACE) 100 MG capsule Take 1 capsule (100 mg total) by mouth 2 (two) times daily as needed for mild constipation. Patient not taking: Reported on 09/25/2015 09/22/14  [name], MD  methocarbamol (ROBAXIN) 500 MG tablet Take 1 tablet (500 mg total) by mouth 3 (three) times daily between meals as needed for muscle spasms. Patient not taking: Reported on 09/25/2015 09/22/14   [name], MD  Multiple Vitamin (MULTIVITAMIN WITH MINERALS) TABS tablet Take 1 tablet by mouth every morning. Reported on 11/07/2015    Historical Provider, MD  Nutritional Supplements (DHEA PO) Take 1 capsule by mouth daily. Reported on 11/07/2015    Historical Provider, MD  OVER THE COUNTER MEDICATION Take 1 tablet by mouth daily. Reported on 11/07/2015    Historical Provider, MD  OVER THE COUNTER MEDICATION Take 1 tablet by mouth daily. Reported on 11/07/2015    Historical Provider, MD  oxyCODONE-acetaminophen (PERCOCET) 5-325 MG per tablet Take 1 tablet by  mouth every 4 (four) hours as needed. Patient not taking: Reported on 11/07/2015 09/22/14   [name], MD  zinc gluconate 50 MG tablet Take 50 mg by mouth daily. Reported on 11/07/2015    Historical Provider, MD     ROS: The patient denies fevers, chills, night sweats, unintentional weight loss, chest pain, palpitations, wheezing, dyspnea on exertion, nausea, vomiting, abdominal pain, dysuria, hematuria, melena, numbness, weakness, or tingling.   All other systems have been reviewed and were otherwise negative with the exception of those mentioned in the HPI and as above.    PHYSICAL EXAM: Filed Vitals:   11/07/15 1017  BP: 152/80  Pulse: 66  Temp: 98.1 F (36.7 C)  Resp: 18   Body mass index is 31.32 kg/(m^2).   General: Alert, no acute distress HEENT:  Normocephalic, atraumatic, oropharynx patent. Eye: Nonie HoyerOMI, [facility]ERL[facility] Cardiovascular:  Regular rate and rhythm, no rubs murmurs or gallops.  No Carotid bruits, radial pulse intact. No pedal edema.  Respiratory: Clear to auscultation bilaterally.  No wheezes, rales, or rhonchi.  No cyanosis, no use of accessory musculature Abdominal: No organomegaly, abdomen is soft and non-tender, positive bowel sounds.  No masses. Musculoskeletal: Gait intact. No edema, tenderness scar over left knee with weakness on knee extension., Skin: No rashes. Neurologic: Facial musculature symmetric. Psychiatric: Patient acts appropriately throughout our interaction. Lymphatic: No cervical or submandibular lymphadenopathy    LABS: Results for orders placed or performed in visit on 11/07/15  POCT CBC  Result Value Ref Range   WBC 7.3 4.6 - 10.2 K/uL   Lymph, poc 3.7 (A) 0.6 - 3.4   POC LYMPH PERCENT 50.2 (A) 10 - 50 %L   MID (cbc) 0.1 0 - 0.9   POC MID % 1.4 0 - 12 %M   POC Granulocyte 3.5 2 - 6.9   Granulocyte percent 48.4 37 - 80 %G   RBC 5.00 4.69 - 6.13 M/uL   Hemoglobin 15.7 14.1 - 18.1 g/dL   HCT, POC 78.245.0 95.643.5 - 53.7 %   MCV 90.1 80 - 97 fL     MCH, POC 31.5 (A) 27 - 31.2 pg   MCHC 35.0 31.8 - 35.4 g/dL   RDW, POC 21.312.4 %   Platelet Count, POC 169 142 - 424 K/uL   MPV 8.9 0 - 99.8 fL  POCT glucose (manual entry)  Result Value Ref Range   POC Glucose 342 (A) 70 - 99 mg/dl  POCT glycosylated hemoglobin (Hb A1C)  Result Value Ref Range   Hemoglobin A1C 10.9    Meds ordered this encounter  Medications  . metFORMIN (GLUCOPHAGE) 500 MG tablet    Sig: Take 1 tablet (500 mg total) by mouth 2 (  two) times daily with a meal.    Dispense:  60 tablet    Refill:  11  . losartan (COZAAR) 50 MG tablet    Sig: Take 1 tablet (50 mg total) by mouth daily.    Dispense:  30 tablet    Refill:  11  . atorvastatin (LIPITOR) 20 MG tablet    Sig: Take 1 tablet (20 mg total) by mouth daily.    Dispense:  30 tablet    Refill:  11    EKG/XRAY:   EKG- pt has  Twave changes V3- V6 1-2 AVL and AVF  ASSESSMENT/PLAN: We'll start baby aspirin one a day. We'll start losartan 50 one a day. We'll start Lipitor 10 mg 1 a day. Referral made to cardiology. Recheck 1 month.I personally performed the services described in this documentation, which was scribed in my presence. The recorded information has been reviewed and is accurate.His nurse coordinator is [name] her office number [phone] her fax number [phone]. Recheck 1 month.I personally performed the services described in this documentation, which was scribed in my presence. The recorded information has been reviewed and is accurate.   Gross sideeffects, risk and benefits, and alternatives of medications d/w patient. Patient is aware that all medications have potential sideeffects and we are unable to predict every sideeffect or drug-drug interaction that may occur.  Lesle Chris MD 11/07/2015 10:20 AM

## 2015-11-08 LAB — PSA: PSA: 0.34 ng/mL (ref <=4.00)

## 2015-12-06 ENCOUNTER — Encounter: Payer: Self-pay | Admitting: Internal Medicine

## 2015-12-06 ENCOUNTER — Ambulatory Visit (INDEPENDENT_AMBULATORY_CARE_PROVIDER_SITE_OTHER): Payer: Worker's Compensation | Admitting: Internal Medicine

## 2015-12-06 VITALS — BP 156/82 | HR 77 | Ht 77.0 in | Wt 262.0 lb

## 2015-12-06 DIAGNOSIS — I10 Essential (primary) hypertension: Secondary | ICD-10-CM

## 2015-12-06 DIAGNOSIS — Z01810 Encounter for preprocedural cardiovascular examination: Secondary | ICD-10-CM | POA: Diagnosis not present

## 2015-12-06 DIAGNOSIS — R9431 Abnormal electrocardiogram [ECG] [EKG]: Secondary | ICD-10-CM

## 2015-12-06 NOTE — Patient Instructions (Addendum)
Your physician has requested that you have an exercise stress myoview. For further information please visit [email or web address]. Please follow instruction sheet, as given.  Your physician has requested that you have an echocardiogram @ [address]. Echocardiography is a painless test that uses sound waves to create images of your heart. It provides your doctor with information about the size and shape of your heart and how well your heart's chambers and valves are working. This procedure takes approximately one hour. There are no restrictions for this procedure.  Your physician recommends that you schedule a follow-up appointment w/Dr. Rennis GoldenHilty after your testing.

## 2015-12-06 NOTE — Progress Notes (Signed)
OFFICE NOTE  Chief Complaint:  Dizziness, preoperative clearance  Primary Care Physician: No PCP Per Patient  HPI:  Robert Sweeney is a 55 y.o. male who is referred to me for evaluation of positional dizziness, abnormal EKG changes including anterolateral deep T-wave inversions and preoperative evaluation prior to surgery. He is planning on an operation with Dr. Shelle Iron. An EKG was performed which showed deep T-wave inversions anterolaterally and laterally. Differential diagnosis includes ischemia, hypertrophic cardiomyopathy, specifically apical hypertrophic cardiomyopathy, and a dilated cardiomyopathy. Certainly electrolyte abnormalities can give these changes as well. He currently denies any chest pain, palpitations or worsening shortness of breath. He does get some dizziness with positional changes. Blood pressure is mildly elevated today.  PMHx:  Past Medical History  Diagnosis Date  . Hypertension     under control through diet and exercise  . History of diabetes mellitus, type II     diet controlled-"no medications needed anymore"  . Headache     sometimes  . Diabetes mellitus without complication (HCC)     type 2    Past Surgical History  Procedure Laterality Date  . No past surgeries    . Quadriceps tendon repair Left 09/22/2014    Procedure: REPAIR LEFT QUADRICEP TENDON with patch graft;  Surgeon: Jene Every, MD;  Location: [facility];  Service: Orthopedics;  Laterality: Left;    FAMHx:  Family History  Problem Relation Age of Onset  . Diabetes Mother     SOCHx:   reports that he quit smoking about 34 years ago. His smoking use included Cigarettes. He has never used smokeless tobacco. He reports that he drinks alcohol. He reports that he does not use illicit drugs.  ALLERGIES:  No Known Allergies  ROS: Pertinent items noted in HPI and remainder of comprehensive ROS otherwise negative.  HOME MEDS: Current Outpatient Prescriptions  Medication Sig Dispense  Refill  . aspirin EC 325 MG tablet Take 325 mg by mouth daily. Reported on 11/07/2015    . atorvastatin (LIPITOR) 20 MG tablet Take 1 tablet (20 mg total) by mouth daily. 30 tablet 11  . b complex vitamins tablet Take 1 tablet by mouth daily. Reported on 11/07/2015    . BEE POLLEN PO Take 1 capsule by mouth daily. Reported on 11/07/2015    . Cinnamon 500 MG TABS Take 1 tablet by mouth daily. Reported on 11/07/2015    . Coenzyme Q10 (CO Q 10 PO) Take 1 capsule by mouth daily. Reported on 11/07/2015    . docusate sodium (COLACE) 100 MG capsule Take 1 capsule (100 mg total) by mouth 2 (two) times daily as needed for mild constipation. 20 capsule 1  . losartan (COZAAR) 50 MG tablet Take 1 tablet (50 mg total) by mouth daily. 30 tablet 11  . metFORMIN (GLUCOPHAGE) 500 MG tablet Take 1 tablet (500 mg total) by mouth 2 (two) times daily with a meal. 60 tablet 11  . methocarbamol (ROBAXIN) 500 MG tablet Take 1 tablet (500 mg total) by mouth 3 (three) times daily between meals as needed for muscle spasms. 40 tablet 1  . Multiple Vitamin (MULTIVITAMIN WITH MINERALS) TABS tablet Take 1 tablet by mouth every morning. Reported on 11/07/2015    . Nutritional Supplements (DHEA PO) Take 1 capsule by mouth daily. Reported on 11/07/2015    . OVER THE COUNTER MEDICATION Take 1 tablet by mouth daily. Reported on 11/07/2015    . OVER THE COUNTER MEDICATION Take 1 tablet by mouth daily. Reported  on 11/07/2015    . oxyCODONE-acetaminophen (PERCOCET) 5-325 MG per tablet Take 1 tablet by mouth every 4 (four) hours as needed. 60 tablet 0  . zinc gluconate 50 MG tablet Take 50 mg by mouth daily. Reported on 11/07/2015     No current facility-administered medications for this visit.    LABS/IMAGING: No results found for this or any previous visit (from the past 48 hour(s)). No results found.  WEIGHTS: Wt Readings from Last 3 Encounters:  12/06/15 262 lb (118.842 kg)  11/07/15 264 lb 3.2 oz (119.84 kg)  09/29/15 268 lb 12.8 oz (121.927  kg)    VITALS: Ht 6\' 5"  (1.956 m)  Wt 262 lb (118.842 kg)  BMI 31.06 kg/m2  EXAM: General appearance: alert and no distress Neck: no carotid bruit and no JVD Lungs: clear to auscultation bilaterally Heart: regular rate and rhythm, S1, S2 normal, no murmur, click, rub or gallop Abdomen: soft, non-tender; bowel sounds normal; no masses,  no organomegaly Extremities: extremities normal, atraumatic, no cyanosis or edema Pulses: 2+ and symmetric Skin: Skin color, texture, turgor normal. No rashes or lesions Neurologic: Grossly normal Psych: Pleasant  EKG: Sinus bradycardia 57, deep anterolateral T-wave inversions  ASSESSMENT: 1. Abnormal EKG with anterolateral T-wave inversions 2. Indeterminate preoperative risk 3. Hypertension 4. Dyslipidemia 5. Type 2 diabetes  PLAN: 1.   Mr. [name] is an indeterminate risk for surgery although has an abnormal EKG with either ischemic changes or changes that can be seen with hypertrophic cardiomyopathy. I like to obtain an echocardiogram to further evaluate this and a nuclear stress test to rule out ischemia prior to providing an accurate assessment of his surgical risk. Blood pressure is slightly elevated today and will recheck that when he returns. He is on cholesterol medication and is being treated for diabetes.  Follow-up with me in a few weeks after his studies are complete. Thanks again for the kind referral.  Chrystie NoseKenneth C. Nikolaj Geraghty, MD, [facility] Attending Cardiologist [facility]  Lisette AbuKenneth C [facility] 12/06/2015, 2:04 PM

## 2015-12-08 ENCOUNTER — Ambulatory Visit (INDEPENDENT_AMBULATORY_CARE_PROVIDER_SITE_OTHER): Payer: Worker's Compensation | Admitting: Physician Assistant

## 2015-12-08 VITALS — BP 158/86 | HR 70 | Temp 98.0°F | Resp 18 | Ht 77.0 in | Wt 260.0 lb

## 2015-12-08 DIAGNOSIS — I10 Essential (primary) hypertension: Secondary | ICD-10-CM

## 2015-12-08 DIAGNOSIS — E119 Type 2 diabetes mellitus without complications: Secondary | ICD-10-CM | POA: Diagnosis not present

## 2015-12-08 LAB — GLUCOSE, POCT (MANUAL RESULT ENTRY): POC Glucose: 243 mg/dl — AB (ref 70–99)

## 2015-12-08 MED ORDER — BLOOD GLUCOSE MONITOR KIT: PACK | Status: AC

## 2015-12-08 NOTE — Patient Instructions (Addendum)
  Lorsartan 50mg  - take 2 pills a day Glucophage - 500mg  - take 2 pills 2x/day Keep check of your glucose with the meter and bring that to your appt Recheck in the next 2 weeks   IF you received an x-ray today, you will receive an invoice from [facility]. Please contact [facility] at [phone] with questions or concerns regarding your invoice.   IF you received labwork today, you will receive an invoice from [facility]. Please contact [facility] at [phone] with questions or concerns regarding your invoice.   Our billing staff will not be able to assist you with questions regarding bills from these companies.  You will be contacted with the lab results as soon as they are available. The fastest way to get your results is to activate your My Chart account. Instructions are located on the last page of this paperwork. If you have not heard from us regarding the results in 2 weeks, please contact this office.

## 2015-12-08 NOTE — Progress Notes (Signed)
MRN: [id number] DOB: Jun 11, 1961  Subjective:  Pt presents to clinic with an injury that occurred at work in 2016.  He has to have repeat surgery on his left knee and when he was getting ready for the surgery his BP and glucose was to high for them to do surgery.  He is here today for f/u of his BP and DM.  He has been on BP and DM medications for about a month and taking without problems.  He is not checking his BP at home.  He does not check his blood sugar at home.  He saw cardiologist yesterday who has scheduled stress test and echo and noted his elevated BP but did not increase his medications.  He has changed his eating and has been exercising to help control his DM - he has done this in the past successfully to stay off of medications.  Review of Systems  Constitutional: Negative for fever and chills.  Respiratory: Negative for cough and shortness of breath.   Cardiovascular: Negative for chest pain, palpitations and leg swelling.  Neurological: Negative for numbness.    Objective:  BP 158/86 mmHg  Pulse 70  Temp(Src) 98 F (36.7 C) (Oral)  Resp 18  Ht 6\' 5"  (1.956 m)  Wt 260 lb (117.935 kg)  BMI 30.83 kg/m2  SpO2 95%  Physical Exam  Constitutional: He is oriented to person, place, and time and well-developed, well-nourished, and in no distress.  HENT:  Head: Normocephalic and atraumatic.  Right Ear: External ear normal.  Left Ear: External ear normal.  Eyes: Conjunctivae are normal.  Neck: Normal range of motion.  Pulmonary/Chest: Effort normal.  Neurological: He is alert and oriented to person, place, and time. Gait normal.  Skin: Skin is warm and dry.  Psychiatric: Mood, memory, affect and judgment normal.   Results for orders placed or performed in visit on 12/08/15  POCT glucose (manual entry)  Result Value Ref Range   POC Glucose 243 (A) 70 - 99 mg/dl    Assessment and Plan :  Essential hypertension - uncontrolled - pt will increase the cozaar to 100mg  qd  -  Type 2 diabetes mellitus without complication, without long-term current use of insulin (HCC) - Plan: POCT glucose (manual entry) - uncontrolled - pt will start to check his glucose at home - we will increase his glucophage to 1000mg  bid -   He has an appt with cards for further work-up on 6/16 - we will recheck him in 2 weeks after his increase in his medications to see if there is improvement in his BP and glucose readings - he will continue to work with his diet to help with improved control of diabetes  Benny LennertSarah Seren Chaloux PA-C  [facility] 12/08/2015 1:51 PM

## 2015-12-20 ENCOUNTER — Telehealth (HOSPITAL_COMMUNITY): Payer: Self-pay

## 2015-12-20 NOTE — Telephone Encounter (Signed)
Encounter complete. 

## 2015-12-22 ENCOUNTER — Ambulatory Visit (HOSPITAL_COMMUNITY)
Admission: RE | Admit: 2015-12-22 | Discharge: 2015-12-22 | Disposition: A | Discharge status: Home / Self Care | Payer: Worker's Compensation | Source: Ambulatory Visit | Attending: Cardiovascular Disease | Admitting: Cardiovascular Disease

## 2015-12-22 DIAGNOSIS — R9439 Abnormal result of other cardiovascular function study: Secondary | ICD-10-CM | POA: Diagnosis not present

## 2015-12-22 DIAGNOSIS — E119 Type 2 diabetes mellitus without complications: Secondary | ICD-10-CM | POA: Insufficient documentation

## 2015-12-22 DIAGNOSIS — Z87891 Personal history of nicotine dependence: Secondary | ICD-10-CM | POA: Insufficient documentation

## 2015-12-22 DIAGNOSIS — Z01810 Encounter for preprocedural cardiovascular examination: Secondary | ICD-10-CM | POA: Insufficient documentation

## 2015-12-22 DIAGNOSIS — R9431 Abnormal electrocardiogram [ECG] [EKG]: Secondary | ICD-10-CM | POA: Diagnosis not present

## 2015-12-22 DIAGNOSIS — I10 Essential (primary) hypertension: Secondary | ICD-10-CM

## 2015-12-22 DIAGNOSIS — R42 Dizziness and giddiness: Secondary | ICD-10-CM | POA: Insufficient documentation

## 2015-12-22 LAB — MYOCARDIAL PERFUSION IMAGING
CHL CUP NUCLEAR SDS: 0
CHL CUP RESTING HR STRESS: 54 {beats}/min
LV dias vol: 189 mL (ref 62–150)
LVSYSVOL: 116 mL
Peak HR: 80 {beats}/min
SRS: 0
SSS: 8
TID: 1.12

## 2015-12-22 MED ORDER — REGADENOSON 0.4 MG/5ML IV SOLN
0.4000 mg | Freq: Once | INTRAVENOUS | Status: AC
  Administered 2015-12-22: 0.4 mg via INTRAVENOUS

## 2015-12-22 MED ORDER — TECHNETIUM TC 99M TETROFOSMIN IV KIT
10.8000 | PACK | Freq: Once | INTRAVENOUS | Status: AC | PRN
  Administered 2015-12-22: 11 via INTRAVENOUS
  Filled 2015-12-22: qty 11

## 2015-12-22 MED ORDER — TECHNETIUM TC 99M TETROFOSMIN IV KIT
30.6000 | PACK | Freq: Once | INTRAVENOUS | Status: AC | PRN
  Administered 2015-12-22: 31 via INTRAVENOUS
  Filled 2015-12-22: qty 31

## 2015-12-28 ENCOUNTER — Ambulatory Visit (INDEPENDENT_AMBULATORY_CARE_PROVIDER_SITE_OTHER): Payer: Worker's Compensation | Admitting: Family Medicine

## 2015-12-28 VITALS — BP 156/84 | HR 55 | Temp 98.0°F | Resp 16 | Ht 74.0 in | Wt 260.0 lb

## 2015-12-28 DIAGNOSIS — I10 Essential (primary) hypertension: Secondary | ICD-10-CM | POA: Diagnosis not present

## 2015-12-28 DIAGNOSIS — E119 Type 2 diabetes mellitus without complications: Secondary | ICD-10-CM | POA: Diagnosis not present

## 2015-12-28 LAB — GLUCOSE, POCT (MANUAL RESULT ENTRY): POC GLUCOSE: 226 mg/dL — AB (ref 70–99)

## 2015-12-28 MED ORDER — LOSARTAN POTASSIUM 100 MG PO TABS: 100.0000 mg | ORAL_TABLET | Freq: Every day | ORAL | Status: DC

## 2015-12-28 MED ORDER — METFORMIN HCL 500 MG PO TABS: 1000.0000 mg | ORAL_TABLET | Freq: Two times a day (BID) | ORAL | Status: DC

## 2015-12-28 MED ORDER — CHLORTHALIDONE 25 MG PO TABS: 12.5000 mg | ORAL_TABLET | Freq: Every day | ORAL | Status: DC

## 2015-12-28 MED ORDER — SITAGLIPTIN PHOSPHATE 100 MG PO TABS: 100.0000 mg | ORAL_TABLET | Freq: Every day | ORAL | Status: DC

## 2015-12-28 NOTE — Progress Notes (Signed)
MRN: [id number] DOB: Aug 05, 1960  Subjective:  12/08/2015 Visit: Pt presents to clinic with an injury that occurred at work in 2016.  He has to have repeat surgery on his left knee and when he was getting ready for the surgery his BP and glucose was to high for them to do surgery.  He is here today for f/u of his BP and DM.  He has been on BP and DM medications for about a month and taking without problems.  He is not checking his BP at home.  He does not check his blood sugar at home.    He has changed his eating and has been exercising to help control his DM - he has done this in the past successfully to stay off of medications.  Today:  Diabetes:  - Taking metformin 1000mg  bid.  No GI side effects - Unsure what blood sugar readings are, mentioned one 300 reading - Usually drinks water or perrier. Occasional sweet tea.  Rare soda.    HTN: Increased Cozaar  to 100mg  at last visit. BP at home is always elevated over 140 systolic.   Unable to workout  due knee pain.    Had stress test 6 days ago, but does not yet know the results.  He believes his follow up is tomorrow. ECHO is tomorrow.    Review of Systems  Constitutional: Negative for fever and chills.  Respiratory: Negative for cough and shortness of breath.   Cardiovascular: Negative for chest pain, palpitations and leg swelling.  Neurological: Negative for numbness.    Objective:  BP 156/84 mmHg  Pulse 55  Temp(Src) 98 F (36.7 C) (Oral)  Resp 16  Ht 6\' 2"  (1.88 m)  Wt 260 lb (117.935 kg)  BMI 33.37 kg/m2  SpO2 98%  Physical Exam  Constitutional: He is oriented to person, place, and time and well-developed, well-nourished, and in no distress.  HENT:  Head: Normocephalic and atraumatic.  Right Ear: External ear normal.  Left Ear: External ear normal.  Eyes: Conjunctivae are normal.  Neck: Normal range of motion.  Pulmonary/Chest: Effort normal.  Neurological: He is alert and oriented to person, place, and time. Gait  normal.  Skin: Skin is warm and dry.  Psychiatric: Mood, memory, affect and judgment normal.   Results for orders placed or performed during the hospital encounter of 12/22/15  Myocardial Perfusion Imaging  Result Value Ref Range   Rest HR 54 bpm   Rest BP 195/104 mmHg   Exercise duration (min)  min   Exercise duration (sec)  sec   Estimated workload  METS   Peak HR 80 bpm   Peak BP 218/90 mmHg   MPHR  bpm   Percent HR  %   RPE     LV sys vol 116 mL   TID 1.12    LV dias vol 189 62 - 150 mL   LHR     SSS 8    SRS 0    SDS 0     Assessment and Plan :  Essential hypertension - uncontrolled -  BP here 156/84, rest BP during stress test 195/104.  - Continue cozaar at 100mg  qd  - Add chlorthalidone 12.5 mg QD - f/u in 2 weeks for repeat BP check/bmp  Type 2 diabetes mellitus without complication, without long-term current use of insulin (HCC) -  - Plan: POCT glucose -  - pt needs to start to check his glucose at home. Mentioned 1 BS of 300. 226  here today.    -Continue metformin at 1000mg  bid -  Add Januvia 100mg  QD, Cr wnl   Continue cardiac workup: He has an appt with cards to f/u on stress test and have ECHO in near future.    - we will recheck him in 2 weeks after his increase in his medications to see if there is improvement in his BP and glucose readings  - he will continue to work with his diet to help with improved control of diabetes/HTN  Guinevere ScarletBlake Aemon Koeller  12/28/2015 11:52 AM

## 2015-12-28 NOTE — Patient Instructions (Addendum)
   IF you received an x-ray today, you will receive an invoice from [facility]. Please contact [facility] at [phone] with questions or concerns regarding your invoice.   IF you received labwork today, you will receive an invoice from [facility]. Please contact [facility] at [phone] with questions or concerns regarding your invoice.   Our billing staff will not be able to assist you with questions regarding bills from these companies.  You will be contacted with the lab results as soon as they are available. The fastest way to get your results is to activate your My Chart account. Instructions are located on the last page of this paperwork. If you have not heard from us regarding the results in 2 weeks, please contact this office.     DASH Eating Plan DASH stands for "Dietary Approaches to Stop Hypertension." The DASH eating plan is a healthy eating plan that has been shown to reduce high blood pressure (hypertension). Additional health benefits may include reducing the risk of type 2 diabetes mellitus, heart disease, and stroke. The DASH eating plan may also help with weight loss. WHAT DO I NEED TO KNOW ABOUT THE DASH EATING PLAN? For the DASH eating plan, you will follow these general guidelines:  Choose foods with a percent daily value for sodium of less than 5% (as listed on the food label).  Use salt-free seasonings or herbs instead of table salt or sea salt.  Check with your health care provider or pharmacist before using salt substitutes.  Eat lower-sodium products, often labeled as "lower sodium" or "no salt added."  Eat fresh foods.  Eat more vegetables, fruits, and low-fat dairy products.  Choose whole grains. Look for the word "whole" as the first word in the ingredient list.  Choose fish and skinless chicken or turkey more often than red meat. Limit fish, poultry, and meat to 6 oz (170 g) each day.  Limit sweets,  desserts, sugars, and sugary drinks.  Choose heart-healthy fats.  Limit cheese to 1 oz (28 g) per day.  Eat more home-cooked food and less restaurant, buffet, and fast food.  Limit fried foods.  Cook foods using methods other than frying.  Limit canned vegetables. If you do use them, rinse them well to decrease the sodium.  When eating at a restaurant, ask that your food be prepared with less salt, or no salt if possible. WHAT FOODS CAN I EAT? Seek help from a dietitian for individual calorie needs. Grains Whole grain or whole wheat bread. Brown rice. Whole grain or whole wheat pasta. Quinoa, bulgur, and whole grain cereals. Low-sodium cereals. Corn or whole wheat flour tortillas. Whole grain cornbread. Whole grain crackers. Low-sodium crackers. Vegetables Fresh or frozen vegetables (raw, steamed, roasted, or grilled). Low-sodium or reduced-sodium tomato and vegetable juices. Low-sodium or reduced-sodium tomato sauce and paste. Low-sodium or reduced-sodium canned vegetables.  Fruits All fresh, canned (in natural juice), or frozen fruits. Meat and Other Protein Products Ground beef (85% or leaner), grass-fed beef, or beef trimmed of fat. Skinless chicken or turkey. Ground chicken or turkey. Pork trimmed of fat. All fish and seafood. Eggs. Dried beans, peas, or lentils. Unsalted nuts and seeds. Unsalted canned beans. Dairy Low-fat dairy products, such as skim or 1% milk, 2% or reduced-fat cheeses, low-fat ricotta or cottage cheese, or plain low-fat yogurt. Low-sodium or reduced-sodium cheeses. Fats and Oils Tub margarines without trans fats. Light or reduced-fat mayonnaise and salad dressings (reduced sodium). Avocado. Safflower, olive, or canola   oils. Natural peanut or almond butter. Other Unsalted popcorn and pretzels. The items listed above may not be a complete list of recommended foods or beverages. Contact your dietitian for more options. WHAT FOODS ARE NOT  RECOMMENDED? Grains White bread. White pasta. White rice. Refined cornbread. Bagels and croissants. Crackers that contain trans fat. Vegetables Creamed or fried vegetables. Vegetables in a cheese sauce. Regular canned vegetables. Regular canned tomato sauce and paste. Regular tomato and vegetable juices. Fruits Dried fruits. Canned fruit in light or heavy syrup. Fruit juice. Meat and Other Protein Products Fatty cuts of meat. Ribs, chicken wings, bacon, sausage, bologna, salami, chitterlings, fatback, hot dogs, bratwurst, and packaged luncheon meats. Salted nuts and seeds. Canned beans with salt. Dairy Whole or 2% milk, cream, half-and-half, and cream cheese. Whole-fat or sweetened yogurt. Full-fat cheeses or blue cheese. Nondairy creamers and whipped toppings. Processed cheese, cheese spreads, or cheese curds. Condiments Onion and garlic salt, seasoned salt, table salt, and sea salt. Canned and packaged gravies. Worcestershire sauce. Tartar sauce. Barbecue sauce. Teriyaki sauce. Soy sauce, including reduced sodium. Steak sauce. Fish sauce. Oyster sauce. Cocktail sauce. Horseradish. Ketchup and mustard. Meat flavorings and tenderizers. Bouillon cubes. Hot sauce. Tabasco sauce. Marinades. Taco seasonings. Relishes. Fats and Oils Butter, stick margarine, lard, shortening, ghee, and bacon fat. Coconut, palm kernel, or palm oils. Regular salad dressings. Other Pickles and olives. Salted popcorn and pretzels. The items listed above may not be a complete list of foods and beverages to avoid. Contact your dietitian for more information. WHERE CAN I FIND MORE INFORMATION? National Heart, Lung, and Blood Institute: [employer]   This information is not intended to replace advice given to you by your health care provider. Make sure you discuss any questions you have with your health care provider.   Document Released: 06/13/2011 Document Revised: 07/15/2014  Document Reviewed: 04/28/2013 Elsevier Interactive Patient Education 2016 [employer]. Diabetes Mellitus and Food It is important for you to manage your blood sugar (glucose) level. Your blood glucose level can be greatly affected by what you eat. Eating healthier foods in the appropriate amounts throughout the day at about the same time each day will help you control your blood glucose level. It can also help slow or prevent worsening of your diabetes mellitus. Healthy eating may even help you improve the level of your blood pressure and reach or maintain a healthy weight.  General recommendations for healthful eating and cooking habits include:  Eating meals and snacks regularly. Avoid going long periods of time without eating to lose weight.  Eating a diet that consists mainly of plant-based foods, such as fruits, vegetables, nuts, legumes, and whole grains.  Using low-heat cooking methods, such as baking, instead of high-heat cooking methods, such as deep frying. Work with your dietitian to make sure you understand how to use the Nutrition Facts information on food labels. HOW CAN FOOD AFFECT ME? Carbohydrates Carbohydrates affect your blood glucose level more than any other type of food. Your dietitian will help you determine how many carbohydrates to eat at each meal and teach you how to count carbohydrates. Counting carbohydrates is important to keep your blood glucose at a healthy level, especially if you are using insulin or taking certain medicines for diabetes mellitus. Alcohol Alcohol can cause sudden decreases in blood glucose (hypoglycemia), especially if you use insulin or take certain medicines for diabetes mellitus. Hypoglycemia can be a life-threatening condition. Symptoms of hypoglycemia (sleepiness, dizziness, and disorientation) are similar to symptoms of having too much  alcohol.  If your health care provider has given you approval to drink alcohol, do so in moderation and use  the following guidelines:  Women should not have more than one drink per day, and men should not have more than two drinks per day. One drink is equal to:  12 oz of beer.  5 oz of wine.  1 oz of hard liquor.  Do not drink on an empty stomach.  Keep yourself hydrated. Have water, diet soda, or unsweetened iced tea.  Regular soda, juice, and other mixers might contain a lot of carbohydrates and should be counted. WHAT FOODS ARE NOT RECOMMENDED? As you make food choices, it is important to remember that all foods are not the same. Some foods have fewer nutrients per serving than other foods, even though they might have the same number of calories or carbohydrates. It is difficult to get your body what it needs when you eat foods with fewer nutrients. Examples of foods that you should avoid that are high in calories and carbohydrates but low in nutrients include:  Trans fats (most processed foods list trans fats on the Nutrition Facts label).  Regular soda.  Juice.  Candy.  Sweets, such as cake, pie, doughnuts, and cookies.  Fried foods. WHAT FOODS CAN I EAT? Eat nutrient-rich foods, which will nourish your body and keep you healthy. The food you should eat also will depend on several factors, including:  The calories you need.  The medicines you take.  Your weight.  Your blood glucose level.  Your blood pressure level.  Your cholesterol level. You should eat a variety of foods, including:  Protein.  Lean cuts of meat.  Proteins low in saturated fats, such as fish, egg whites, and beans. Avoid processed meats.  Fruits and vegetables.  Fruits and vegetables that may help control blood glucose levels, such as apples, mangoes, and yams.  Dairy products.  Choose fat-free or low-fat dairy products, such as milk, yogurt, and cheese.  Grains, bread, pasta, and rice.  Choose whole grain products, such as multigrain bread, whole oats, and brown rice. These foods may  help control blood pressure.  Fats.  Foods containing healthful fats, such as nuts, avocado, olive oil, canola oil, and fish. DOES EVERYONE WITH DIABETES MELLITUS HAVE THE SAME MEAL PLAN? Because every person with diabetes mellitus is different, there is not one meal plan that works for everyone. It is very important that you meet with a dietitian who will help you create a meal plan that is just right for you.   This information is not intended to replace advice given to you by your health care provider. Make sure you discuss any questions you have with your health care provider.   Document Released: 03/21/2005 Document Revised: 07/15/2014 Document Reviewed: 05/21/2013 Elsevier Interactive Patient Education 2016 [employer] Diabetes and Exercise Exercising regularly is important. It is not just about losing weight. It has many health benefits, such as:  Improving your overall fitness, flexibility, and endurance.  Increasing your bone density.  Helping with weight control.  Decreasing your body fat.  Increasing your muscle strength.  Reducing stress and tension.  Improving your overall health. People with diabetes who exercise gain additional benefits because exercise:  Reduces appetite.  Improves the body's use of blood sugar (glucose).  Helps lower or control blood glucose.  Decreases blood pressure.  Helps control blood lipids (such as cholesterol and triglycerides).  Improves the body's use of the hormone insulin by:  Increasing the body's insulin sensitivity.  Reducing the body's insulin needs.  Decreases the risk for heart disease because exercising:  Lowers cholesterol and triglycerides levels.  Increases the levels of good cholesterol (such as high-density lipoproteins [HDL]) in the body.  Lowers blood glucose levels. YOUR ACTIVITY PLAN  Choose an activity that you enjoy, and set realistic goals. To exercise safely, you should begin practicing any new  physical activity slowly, and gradually increase the intensity of the exercise over time. Your health care provider or diabetes educator can help create an activity plan that works for you. General recommendations include:  Encouraging children to engage in at least 60 minutes of physical activity each day.  Stretching and performing strength training exercises, such as yoga or weight lifting, at least 2 times per week.  Performing a total of at least 150 minutes of moderate-intensity exercise each week, such as brisk walking or water aerobics.  Exercising at least 3 days per week, making sure you allow no more than 2 consecutive days to pass without exercising.  Avoiding long periods of inactivity (90 minutes or more). When you have to spend an extended period of time sitting down, take frequent breaks to walk or stretch. RECOMMENDATIONS FOR EXERCISING WITH TYPE 1 OR TYPE 2 DIABETES   Check your blood glucose before exercising. If blood glucose levels are greater than 240 mg/dL, check for urine ketones. Do not exercise if ketones are present.  Avoid injecting insulin into areas of the body that are going to be exercised. For example, avoid injecting insulin into:  The arms when playing tennis.  The legs when jogging.  Keep a record of:  Food intake before and after you exercise.  Expected peak times of insulin action.  Blood glucose levels before and after you exercise.  The type and amount of exercise you have done.  Review your records with your health care provider. Your health care provider will help you to develop guidelines for adjusting food intake and insulin amounts before and after exercising.  If you take insulin or oral hypoglycemic agents, watch for signs and symptoms of hypoglycemia. They include:  Dizziness.  Shaking.  Sweating.  Chills.  Confusion.  Drink plenty of water while you exercise to prevent dehydration or heat stroke. Body water is lost during  exercise and must be replaced.  Talk to your health care provider before starting an exercise program to make sure it is safe for you. Remember, almost any type of activity is better than none.   This information is not intended to replace advice given to you by your health care provider. Make sure you discuss any questions you have with your health care provider.   Document Released: 09/14/2003 Document Revised: 11/08/2014 Document Reviewed: 12/01/2012 Elsevier Interactive Patient Education 2016 [employer] Hypertension Hypertension, commonly called high blood pressure, is when the force of blood pumping through your arteries is too strong. Your arteries are the blood vessels that carry blood from your heart throughout your body. A blood pressure reading consists of a higher number over a lower number, such as 110/72. The higher number (systolic) is the pressure inside your arteries when your heart pumps. The lower number (diastolic) is the pressure inside your arteries when your heart relaxes. Ideally you want your blood pressure below 120/80. Hypertension forces your heart to work harder to pump blood. Your arteries may become narrow or stiff. Having untreated or uncontrolled hypertension can cause heart attack, stroke, kidney disease, and other problems. RISK  FACTORS Some risk factors for high blood pressure are controllable. Others are not.  Risk factors you cannot control include:   Race. You may be at higher risk if you are African American.  Age. Risk increases with age.  Gender. Men are at higher risk than women before age 80 years. After age 28, women are at higher risk than men. Risk factors you can control include:  Not getting enough exercise or physical activity.  Being overweight.  Getting too much fat, sugar, calories, or salt in your diet.  Drinking too much alcohol. SIGNS AND SYMPTOMS Hypertension does not usually cause signs or symptoms. Extremely high blood  pressure (hypertensive crisis) may cause headache, anxiety, shortness of breath, and nosebleed. DIAGNOSIS To check if you have hypertension, your health care provider will measure your blood pressure while you are seated, with your arm held at the level of your heart. It should be measured at least twice using the same arm. Certain conditions can cause a difference in blood pressure between your right and left arms. A blood pressure reading that is higher than normal on one occasion does not mean that you need treatment. If it is not clear whether you have high blood pressure, you may be asked to return on a different day to have your blood pressure checked again. Or, you may be asked to monitor your blood pressure at home for 1 or more weeks. TREATMENT Treating high blood pressure includes making lifestyle changes and possibly taking medicine. Living a healthy lifestyle can help lower high blood pressure. You may need to change some of your habits. Lifestyle changes may include:  Following the DASH diet. This diet is high in fruits, vegetables, and whole grains. It is low in salt, red meat, and added sugars.  Keep your sodium intake below 2,300 mg per day.  Getting at least 30-45 minutes of aerobic exercise at least 4 times per week.  Losing weight if necessary.  Not smoking.  Limiting alcoholic beverages.  Learning ways to reduce stress. Your health care provider may prescribe medicine if lifestyle changes are not enough to get your blood pressure under control, and if one of the following is true:  You are 59-5 years of age and your systolic blood pressure is above 140.  You are 50 years of age or older, and your systolic blood pressure is above 150.  Your diastolic blood pressure is above 90.  You have diabetes, and your systolic blood pressure is over 140 or your diastolic blood pressure is over 90.  You have kidney disease and your blood pressure is above 140/90.  You have  heart disease and your blood pressure is above 140/90. Your personal target blood pressure may vary depending on your medical conditions, your age, and other factors. HOME CARE INSTRUCTIONS  Have your blood pressure rechecked as directed by your health care provider.   Take medicines only as directed by your health care provider. Follow the directions carefully. Blood pressure medicines must be taken as prescribed. The medicine does not work as well when you skip doses. Skipping doses also puts you at risk for problems.  Do not smoke.   Monitor your blood pressure at home as directed by your health care provider. SEEK MEDICAL CARE IF:   You think you are having a reaction to medicines taken.  You have recurrent headaches or feel dizzy.  You have swelling in your ankles.  You have trouble with your vision. SEEK IMMEDIATE MEDICAL CARE IF:  You develop a severe headache or confusion.  You have unusual weakness, numbness, or feel faint.  You have severe chest or abdominal pain.  You vomit repeatedly.  You have trouble breathing. MAKE SURE YOU:   Understand these instructions.  Will watch your condition.  Will get help right away if you are not doing well or get worse.   This information is not intended to replace advice given to you by your health care provider. Make sure you discuss any questions you have with your health care provider.   Document Released: 06/24/2005 Document Revised: 11/08/2014 Document Reviewed: 04/16/2013 Elsevier Interactive Patient Education [employer]

## 2015-12-29 ENCOUNTER — Other Ambulatory Visit: Payer: Self-pay

## 2015-12-29 ENCOUNTER — Ambulatory Visit (HOSPITAL_COMMUNITY): Payer: Worker's Compensation | Attending: Cardiovascular Disease

## 2015-12-29 DIAGNOSIS — E119 Type 2 diabetes mellitus without complications: Secondary | ICD-10-CM | POA: Insufficient documentation

## 2015-12-29 DIAGNOSIS — R9431 Abnormal electrocardiogram [ECG] [EKG]: Secondary | ICD-10-CM | POA: Insufficient documentation

## 2015-12-29 DIAGNOSIS — I10 Essential (primary) hypertension: Secondary | ICD-10-CM

## 2015-12-29 DIAGNOSIS — Z01810 Encounter for preprocedural cardiovascular examination: Secondary | ICD-10-CM

## 2015-12-29 DIAGNOSIS — I119 Hypertensive heart disease without heart failure: Secondary | ICD-10-CM | POA: Insufficient documentation

## 2015-12-29 DIAGNOSIS — I351 Nonrheumatic aortic (valve) insufficiency: Secondary | ICD-10-CM | POA: Insufficient documentation

## 2015-12-29 DIAGNOSIS — E785 Hyperlipidemia, unspecified: Secondary | ICD-10-CM | POA: Diagnosis not present

## 2015-12-29 LAB — ECHOCARDIOGRAM COMPLETE
AVLVOTPG: 4 mmHg
Ao-asc: 37 cm
CHL CUP DOP CALC LVOT VTI: 19.7 cm
CHL CUP MV DEC (S): 211
E/e' ratio: 15.44
EWDT: 211 ms
FS: 41 % (ref 28–44)
IV/PV OW: 0.96
LA ID, A-P, ES: 44 mm
LA vol A4C: 103 ml
LA vol index: 37.4 mL/m2
LA vol: 94 mL
LADIAMINDEX: 1.75 cm/m2
LDCA: 3.8 cm2
LEFT ATRIUM END SYS DIAM: 44 mm
LV E/e'average: 15.44
LV PW d: 15.6 mm — AB (ref 0.6–1.1)
LV TDI E'MEDIAL: 4.19
LV e' LATERAL: 4.19 cm/s
LVEEMED: 15.44
LVOT SV: 75 mL
LVOTD: 22 mm
LVOTPV: 97 cm/s
MV pk A vel: 71.6 m/s
MVPKEVEL: 64.7 m/s
P 1/2 time: 518 ms
RV LATERAL S' VELOCITY: 11.9 cm/s
TDI e' lateral: 4.19

## 2016-01-12 ENCOUNTER — Encounter: Payer: Self-pay | Admitting: Internal Medicine

## 2016-01-12 ENCOUNTER — Ambulatory Visit (INDEPENDENT_AMBULATORY_CARE_PROVIDER_SITE_OTHER): Payer: Worker's Compensation | Admitting: Internal Medicine

## 2016-01-12 ENCOUNTER — Telehealth: Payer: Self-pay | Admitting: Internal Medicine

## 2016-01-12 VITALS — BP 180/88 | HR 67 | Ht 77.0 in | Wt 259.2 lb

## 2016-01-12 DIAGNOSIS — Z01818 Encounter for other preprocedural examination: Secondary | ICD-10-CM | POA: Insufficient documentation

## 2016-01-12 DIAGNOSIS — Z79899 Other long term (current) drug therapy: Secondary | ICD-10-CM

## 2016-01-12 DIAGNOSIS — R5383 Other fatigue: Secondary | ICD-10-CM | POA: Diagnosis not present

## 2016-01-12 DIAGNOSIS — I10 Essential (primary) hypertension: Secondary | ICD-10-CM

## 2016-01-12 DIAGNOSIS — R931 Abnormal findings on diagnostic imaging of heart and coronary circulation: Secondary | ICD-10-CM | POA: Diagnosis not present

## 2016-01-12 DIAGNOSIS — D689 Coagulation defect, unspecified: Secondary | ICD-10-CM | POA: Diagnosis not present

## 2016-01-12 DIAGNOSIS — E119 Type 2 diabetes mellitus without complications: Secondary | ICD-10-CM

## 2016-01-12 DIAGNOSIS — R9439 Abnormal result of other cardiovascular function study: Secondary | ICD-10-CM | POA: Insufficient documentation

## 2016-01-12 LAB — PROTIME-INR
INR: 1
PROTHROMBIN TIME: 10.8 s (ref 9.0–11.5)

## 2016-01-12 LAB — CBC
HCT: 44.5 % (ref 38.5–50.0)
Hemoglobin: 14.6 g/dL (ref 13.2–17.1)
MCH: 30.9 pg (ref 27.0–33.0)
MCHC: 32.8 g/dL (ref 32.0–36.0)
MCV: 94.3 fL (ref 80.0–100.0)
MPV: 12.7 fL — ABNORMAL HIGH (ref 7.5–12.5)
PLATELETS: 217 10*3/uL (ref 140–400)
RBC: 4.72 MIL/uL (ref 4.20–5.80)
RDW: 12.5 % (ref 11.0–15.0)
WBC: 7.1 10*3/uL (ref 3.8–10.8)

## 2016-01-12 LAB — APTT: APTT: 27 s (ref 22–34)

## 2016-01-12 LAB — TSH: TSH: 0.9 m[IU]/L (ref 0.40–4.50)

## 2016-01-12 NOTE — Telephone Encounter (Signed)
Spoke w/Melissa Barham, [facility] adjuster to see if they would authorize paying for heart cath for surgical clearance.  She stated she is working with the case manager and they would request Dr. Blanchie DessertHilty's report.  Once that is reviewed they will decide if they will authorize payment for heart cath.  Spoke w/pt he does not have any insurance.  He will be self pay if [facility] doesn't pay.

## 2016-01-12 NOTE — Telephone Encounter (Signed)
Spoke w

## 2016-01-12 NOTE — Progress Notes (Signed)
OFFICE NOTE  Chief Complaint:  Follow-up studies  Primary Care Physician: No PCP Per Patient  HPI:  Robert Sweeney is a 55 y.o. male who is referred to me for evaluation of positional dizziness, abnormal EKG changes including anterolateral deep T-wave inversions and preoperative evaluation prior to surgery. He is planning on an operation with Dr. Tonita Cong. An EKG was performed which showed deep T-wave inversions anterolaterally and laterally. Differential diagnosis includes ischemia, hypertrophic cardiomyopathy, specifically apical hypertrophic cardiomyopathy, and a dilated cardiomyopathy. Certainly electrolyte abnormalities can give these changes as well. He currently denies any chest pain, palpitations or worsening shortness of breath. He does get some dizziness with positional changes. Blood pressure is mildly elevated today.  01/12/2016  Robert Sweeney returns today for follow-up. He underwent a stress test and echocardiogram for further evaluation of an abnormal EKG as part of a preoperative assessment. Unfortunately, his nuclear stress test was abnormal, demonstrating a decreased LVEF to 39% with a fixed inferior defect and mild peri-infarct ischemia concerning for scar. The study was read as intermediate risk. He also underwent an echocardiogram, which interestingly, does not show any abnormal wall motion and a normal EF of 60-65%, however there is moderate LVH and moderate left atrial enlargement suggestive of hypertensive heart disease. I went over both of these findings with him in detail today and because of the discrepant results and his abnormal EKG as well as multiple cardiac risk factors including uncontrolled hypertension and diabetes, I'm recommending a left heart catheterization to evaluate for definitive coronary artery disease.  PMHx:  Past Medical History  Diagnosis Date  . Hypertension     under control through diet and exercise  . History of diabetes mellitus, type II     diet  controlled-"no medications needed anymore"  . Headache     sometimes  . Diabetes mellitus without complication ([place])     type 2    Past Surgical History  Procedure Laterality Date  . No past surgeries    . Quadriceps tendon repair Left 09/22/2014    Procedure: REPAIR LEFT QUADRICEP TENDON with patch graft;  Surgeon: Susa Day, MD;  Location: [facility];  Service: Orthopedics;  Laterality: Left;    FAMHx:  Family History  Problem Relation Age of Onset  . Diabetes Mother   . Hypertension Mother     SOCHx:   reports that he quit smoking about 34 years ago. His smoking use included Cigarettes. He has never used smokeless tobacco. He reports that he drinks alcohol. He reports that he does not use illicit drugs.  ALLERGIES:  No Known Allergies  ROS: Pertinent items noted in HPI and remainder of comprehensive ROS otherwise negative.  HOME MEDS: Current Outpatient Prescriptions  Medication Sig Dispense Refill  . aspirin EC 325 MG tablet Take 325 mg by mouth daily. Reported on 11/07/2015    . atorvastatin (LIPITOR) 20 MG tablet Take 1 tablet (20 mg total) by mouth daily. 30 tablet 11  . b complex vitamins tablet Take 1 tablet by mouth daily. Reported on 11/07/2015    . BEE POLLEN PO Take 1 capsule by mouth daily. Reported on 11/07/2015    . blood glucose meter kit and supplies KIT Dispense based on patient and insurance preference. Check once a day at differing times 1 each 0  . chlorthalidone (HYGROTON) 25 MG tablet Take 0.5 tablets (12.5 mg total) by mouth daily. 30 tablet 3  . Cinnamon 500 MG TABS Take 1 tablet by mouth daily. Reported  on 11/07/2015    . Coenzyme Q10 (CO Q 10 PO) Take 1 capsule by mouth daily. Reported on 11/07/2015    . docusate sodium (COLACE) 100 MG capsule Take 1 capsule (100 mg total) by mouth 2 (two) times daily as needed for mild constipation. 20 capsule 1  . losartan (COZAAR) 100 MG tablet Take 1 tablet (100 mg total) by mouth daily. 30 tablet 5  . metFORMIN  (GLUCOPHAGE) 500 MG tablet Take 2 tablets (1,000 mg total) by mouth 2 (two) times daily with a meal. 60 tablet 11  . Multiple Vitamin (MULTIVITAMIN WITH MINERALS) TABS tablet Take 1 tablet by mouth every morning. Reported on 11/07/2015    . Nutritional Supplements (DHEA PO) Take 1 capsule by mouth daily. Reported on 11/07/2015    . OVER THE COUNTER MEDICATION Take 1 tablet by mouth daily. Reported on 11/07/2015    . OVER THE COUNTER MEDICATION Take 1 tablet by mouth daily. Reported on 11/07/2015    . oxyCODONE-acetaminophen (PERCOCET) 7.5-325 MG tablet Take 1 tablet by mouth every 4 (four) hours as needed. (pain)  0  . sitaGLIPtin (JANUVIA) 100 MG tablet Take 1 tablet (100 mg total) by mouth daily. 30 tablet 6  . zinc gluconate 50 MG tablet Take 50 mg by mouth daily. Reported on 11/07/2015     No current facility-administered medications for this visit.    LABS/IMAGING: No results found for this or any previous visit (from the past 48 hour(s)). No results found.  WEIGHTS: Wt Readings from Last 3 Encounters:  01/12/16 259 lb 3.2 oz (117.572 kg)  12/28/15 260 lb (117.935 kg)  12/22/15 260 lb (117.935 kg)    VITALS: BP 180/88 mmHg  Pulse 67  Ht '6\' 5"'$  (1.956 m)  Wt 259 lb 3.2 oz (117.572 kg)  BMI 30.73 kg/m2  EXAM: Deferred  EKG: Deferred  ASSESSMENT: 1. Abnormal Myoview stress test with an EF of 39% and mostly fixed inferior defect 2. LVEF 60-65% with moderate LVH and moderate left atrial enlargement by echo 3. Abnormal EKG with anterolateral T-wave inversions 4. Indeterminate preoperative risk 5. Hypertension 6. Dyslipidemia 7. Type 2 diabetes  PLAN: 1.   Robert Sweeney had discrepant findings on his nuclear stress test and echo however given his abnormal EKG and multiple risk factors I'm concerned about underlying coronary disease. The nuclear stress test could false positive, but I cannot definitively rule that out. He is not currently having any chest pain. Nevertheless he would be at  increased risk for surgery. I'm recommended left heart catheterization for definitive coronary evaluation. 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 He seems satisfied by those explanations and was agreeable to the procedure.  Follow-up with me afterwards. I spent a total of 25 minutes with both the patient and his family member and caseworker.  Pixie Casino, MD, [facility] Attending Cardiologist [place] [address] [place] 01/12/2016, 10:49 AM

## 2016-01-12 NOTE — Patient Instructions (Addendum)
Medication Instructions:  No Changes  Testing/Procedures: Your physician has requested that you have a cardiac catheterization @ [facility]. Cardiac catheterization is used to diagnose and/or treat various heart conditions. Doctors may recommend this procedure for a number of different reasons. The most common reason is to evaluate chest pain. Chest pain can be a symptom of coronary artery disease (CAD), and cardiac catheterization can show whether plaque is narrowing or blocking your heart's arteries. This procedure is also used to evaluate the valves, as well as measure the blood flow and oxygen levels in different parts of your heart. For further information please visit [email or web address] Please follow instruction sheet, as given.  Following your catheterization, you will not be allowed to drive for 3 days.  No lifting, pushing, or pulling greater that 10 pounds is allowed for 1 week.  You will be required to have the following tests prior to the procedure:  1. Blood work - the blood work can be done no more than 14 days prior to the procedure.  It can be done at any [facility] lab. There is a lab downstairs on the first floor of this building in [address] and one at [address].  2. Chest X-ray - this can be done at [facility] in the [facility] @ [address]   Follow-Up: Your physician recommends that you schedule a follow-up appointment after Cath 2-3 weeks    Any Other Special Instructions Will Be Listed Below (If Applicable).     If you need a refill on your cardiac medications before your next appointment, please call your pharmacy.

## 2016-01-13 LAB — BASIC METABOLIC PANEL
BUN: 25 mg/dL (ref 7–25)
CALCIUM: 9.8 mg/dL (ref 8.6–10.3)
CO2: 31 mmol/L (ref 20–31)
CREATININE: 1.08 mg/dL (ref 0.70–1.33)
Chloride: 98 mmol/L (ref 98–110)
Glucose, Bld: 243 mg/dL — ABNORMAL HIGH (ref 65–99)
Potassium: 4.1 mmol/L (ref 3.5–5.3)
Sodium: 137 mmol/L (ref 135–146)

## 2016-01-15 DIAGNOSIS — Z0271 Encounter for disability determination: Secondary | ICD-10-CM

## 2016-01-16 ENCOUNTER — Encounter (HOSPITAL_COMMUNITY): Admission: RE | Payer: Self-pay | Source: Ambulatory Visit

## 2016-01-16 ENCOUNTER — Ambulatory Visit (HOSPITAL_COMMUNITY)
Admission: RE | Admit: 2016-01-16 | Payer: Worker's Compensation | Source: Ambulatory Visit | Admitting: Internal Medicine

## 2016-01-16 SURGERY — LEFT HEART CATH AND CORONARY ANGIOGRAPHY
Anesthesia: LOCAL

## 2016-01-17 ENCOUNTER — Ambulatory Visit (INDEPENDENT_AMBULATORY_CARE_PROVIDER_SITE_OTHER): Payer: Worker's Compensation | Admitting: Family Medicine

## 2016-01-17 VITALS — BP 140/80 | HR 62 | Temp 98.2°F | Resp 18 | Ht 77.0 in | Wt 259.0 lb

## 2016-01-17 DIAGNOSIS — I10 Essential (primary) hypertension: Secondary | ICD-10-CM

## 2016-01-17 DIAGNOSIS — IMO0001 Reserved for inherently not codable concepts without codable children: Secondary | ICD-10-CM

## 2016-01-17 DIAGNOSIS — E1165 Type 2 diabetes mellitus with hyperglycemia: Secondary | ICD-10-CM | POA: Diagnosis not present

## 2016-01-17 LAB — GLUCOSE, POCT (MANUAL RESULT ENTRY): POC GLUCOSE: 189 mg/dL — AB (ref 70–99)

## 2016-01-17 MED ORDER — DAPAGLIFLOZIN PROPANEDIOL 5 MG PO TABS: 5.0000 mg | ORAL_TABLET | Freq: Every day | ORAL | Status: DC

## 2016-01-17 NOTE — Patient Instructions (Addendum)
     IF you received an x-ray today, you will receive an invoice from [facility]. Please contact [facility] at [phone] with questions or concerns regarding your invoice.   IF you received labwork today, you will receive an invoice from [facility]. Please contact [facility] at [phone] with questions or concerns regarding your invoice.   Our billing staff will not be able to assist you with questions regarding bills from these companies.  You will be contacted with the lab results as soon as they are available. The fastest way to get your results is to activate your My Chart account. Instructions are located on the last page of this paperwork. If you have not heard from us regarding the results in 2 weeks, please contact this office.    Continue metformin and Januvia at same doses, start new medication for diabetes, [place].  I will start you on the lower dose, but if you do tolerate this medication, especially if a higher dose is needed, this is available as a combination with metformin.  Keep a record of your blood pressures outside of the office and bring them to the next office visit. Restart the chlorthalidone at one half pill per day. Continue other medication at previous doses.  Measure blood sugars outside of the office fasting, or 2 hours after meals, and bring a record of these to your next visit. Follow-up in 3 weeks, sooner if any new or worsening symptoms.

## 2016-01-17 NOTE — Progress Notes (Addendum)
Subjective:  By signing my name below, I, Robert Sweeney, attest that this documentation has been prepared under the direction and in the presence of Robert Ray, MD. Electronically Signed: Moises Sweeney, [place]. 01/17/2016 , 10:21 AM .  Patient was seen in Room 6 .   Patient ID: Robert Sweeney, male    DOB: 10/01/1960, 55 y.o.   MRN: [id number] Chief Complaint  Patient presents with  . WORKER'S COMP    KNEE   HPI Robert Sweeney is a 55 y.o. male Here for follow up on elevated Sweeney pressure and elevated Sweeney sugar. He initially had an injury at work back in 2016. He was originally seen by Robert Hummingbird, PA-C on June 2nd, 2016. He noted that he needed repeat surgery on his left knee, but initial surgery was cancelled in March 2017 due to elevated glucose of 327 and A1c at 10.4. He was seen for follow up by Dr. [name] on June 22nd. His BP was still elevated at that time, so chlorthalidone 12.'5mg'$  qd was added and continued on losartan '100mg'$  qd. His sugar was also still elevated and added januvai '100mg'$  qd.   Diabetes Lab Results  Component Value Date   HGBA1C 10.9 11/07/2015   He's been checking his sugar at home occasionally, which is still running high at about 200~300s. His lows have been in the high 100s. He's still continuing to take his medications daily. He's on metformin '1000mg'$  bid and januvia '100mg'$  qd. He denies any complications with his medications. He's had diabetes for years, and was able to manage with diet and exercise, but mentions of recent stress. He was previously followed by a PCP for diabetes, however hasn't seen in years.   HTN His BP is still elevated. He was added chlorthalidone 12.'5mg'$  qd and continued losartan '100mg'$  qd. He is planned for cath due to intermediate stress testing; last cardiologist visit was July 7th.   He hasn't been checking his BP at home because he doesn't have a meter at home. He denies checking his BP at [facility] or outside stores. He denies  complications with his medication. He denies chest pain, shortness of breath or Sweeney in stool. Has been out of chlorthalidone for the past 2 days as had been taking 1 pill per Sweeney and supposed to one half pill. Was not having lightheadedness or dizziness with this dose, but also was not checking home but pressure readings.  Lab Results  Component Value Date   CREATININE 1.08 01/12/2016   No Known Allergies Prior to Admission medications   Medication Sig Start Date End Date Taking? Authorizing Provider  aspirin EC 325 MG tablet Take 325 mg by mouth daily. Reported on 11/07/2015   Yes Historical Provider, MD  atorvastatin (LIPITOR) 20 MG tablet Take 1 tablet (20 mg total) by mouth daily. 11/07/15  Yes Robert Russian, MD  b complex vitamins tablet Take 1 tablet by mouth daily. Reported on 11/07/2015   Yes Historical Provider, MD  BEE POLLEN PO Take 1 capsule by mouth daily. Reported on 11/07/2015   Yes Historical Provider, MD  Sweeney glucose meter kit and supplies KIT Dispense based on patient and insurance preference. Check once [name] at differing times 12/08/15  Yes Robert Bale, PA-C  chlorthalidone (HYGROTON) 25 MG tablet Take 0.5 tablets (12.5 mg total) by mouth daily. 12/28/15  Yes Robert Pebbles, MD  Cinnamon 500 MG TABS Take 1 tablet by mouth daily. Reported on 11/07/2015   Yes Historical Provider,  MD  Coenzyme Q10 (CO Q 10 PO) Take 1 capsule by mouth daily. Reported on 11/07/2015   Yes Historical Provider, MD  docusate sodium (COLACE) 100 MG capsule Take 1 capsule (100 mg total) by mouth 2 (two) times daily as needed for mild constipation. 09/22/14  Yes Robert Day, MD  losartan (COZAAR) 100 MG tablet Take 1 tablet (100 mg total) by mouth daily. 12/28/15  Yes Robert Pebbles, MD  metFORMIN (GLUCOPHAGE) 500 MG tablet Take 2 tablets (1,000 mg total) by mouth 2 (two) times daily with a meal. 12/28/15  Yes Robert Pebbles, MD  Multiple Vitamin (MULTIVITAMIN WITH MINERALS) TABS tablet Take 1 tablet by mouth every  morning. Reported on 11/07/2015   Yes Historical Provider, MD  Nutritional Supplements (DHEA PO) Take 1 capsule by mouth daily. Reported on 11/07/2015   Yes Historical Provider, MD  OVER THE COUNTER MEDICATION Take 1 tablet by mouth daily. Reported on 11/07/2015   Yes Historical Provider, MD  OVER THE COUNTER MEDICATION Take 1 tablet by mouth daily. Reported on 11/07/2015   Yes Historical Provider, MD  oxyCODONE-acetaminophen (PERCOCET) 7.5-325 MG tablet Take 1 tablet by mouth every 4 (four) hours as needed. (pain) 01/03/16  Yes Historical Provider, MD  sitaGLIPtin (JANUVIA) 100 MG tablet Take 1 tablet (100 mg total) by mouth daily. 12/28/15  Yes Robert Pebbles, MD  zinc gluconate 50 MG tablet Take 50 mg by mouth daily. Reported on 11/07/2015   Yes Historical Provider, MD   Review of Systems  Constitutional: Negative for fatigue and unexpected weight change.  Eyes: Negative for visual disturbance.  Respiratory: Negative for cough, chest tightness and shortness of breath.   Cardiovascular: Negative for chest pain, palpitations and leg swelling.  Gastrointestinal: Negative for abdominal pain and Sweeney in stool.  Neurological: Negative for dizziness, light-headedness and headaches.       Objective:   Physical Exam  Constitutional: He is oriented to person, place, and time. He appears well-developed and well-nourished.  HENT:  Head: Normocephalic and atraumatic.  Eyes: EOM are normal. Pupils are equal, round, and reactive to light.  Neck: No JVD present. Carotid bruit is not present.  Cardiovascular: Normal rate, regular rhythm and normal heart sounds.   No murmur heard. Pulmonary/Chest: Effort normal and breath sounds normal. He has no rales.  Musculoskeletal: He exhibits no edema.  Neurological: He is alert and oriented to person, place, and time.  Skin: Skin is warm and dry.  Psychiatric: He has a normal mood and affect.  Vitals reviewed.   Filed Vitals:   01/17/16 0925  BP: 140/80  Pulse:  62  Temp: 98.2 F (36.8 C)  TempSrc: Oral  Resp: 18  Height: '6\' 5"'$  (1.956 m)  Weight: 259 lb (117.482 kg)  SpO2: 97%   Results for orders placed or performed in visit on 01/17/16  POCT glucose (manual entry)  Result Value Ref Range   POC Glucose 189 (A) 70 - 99 mg/dl      Assessment & Plan:   AZRAEL MADDIX is a 55 y.o. male  Here for follow-up of hypertension and diabetes, noted as above through testing prior to surgery for knee injury at work  Uncontrolled type 2 diabetes mellitus without complication, without long-term current use of insulin ([place]) - Plan: POCT glucose (manual entry), dapagliflozin propanediol (FARXIGA) 5 MG TABS tablet  - Still uncontrolled, will continue metformin 1000 mg twice [name], Januvia 100 mg daily, add Farxiga 5 mg daily. Check home Sweeney sugar readings episodically, including  fasting, and 2 hours after meals, and recheck in 3 weeks. Prior creatinine okay, consider repeat creatinine at follow-up as on Farxiga.   Essential hypertension  - improved control in office, including off of chlorthalidone past 2 days. Unfortunately not able to refill the chlorthalidone through pharmacy for another 8 days based on previous prescription dosing and him taking the pill once per Sweeney accidentally instead of half pill. We did try calling pharmacy to check into this.  He can refill sooner, but would be out of pocket. Will advise patient and his caseworker of this. If he is unable to refill that medication prior to 8 days from now, should monitor outside readings to make sure it remains below 140/90. If not, either needs other medication in the meantime, or to refill chlorthalidone early as above.  -Continue follow-up with cardiology for abnormal stress test, and plan for cardiac catheterization.  Recheck in 3 weeks.  Meds ordered this encounter  Medications  . dapagliflozin propanediol (FARXIGA) 5 MG TABS tablet    Sig: Take 5 mg by mouth daily.    Dispense:  30 tablet     Refill:  1   Patient Instructions       IF you received an x-Sweeney today, you will receive an invoice from [facility]. Please contact [facility] at [phone] with questions or concerns regarding your invoice.   IF you received labwork today, you will receive an invoice from [employer]. Please contact [facility] at [phone] with questions or concerns regarding your invoice.   Our billing staff will not be able to assist you with questions regarding bills from these companies.  You will be contacted with the lab results as soon as they are available. The fastest way to get your results is to activate your My Chart account. Instructions are located on the last page of this paperwork. If you have not heard from [place] regarding the results in 2 weeks, please contact this office.    Continue metformin and Januvia at same doses, start new medication for diabetes, [place].  I will start you on the lower dose, but if you do tolerate this medication, especially if a higher dose is needed, this is available as a combination with metformin.  Keep a record of your Sweeney pressures outside of the office and bring them to the next office visit. Restart the chlorthalidone at one half pill per Sweeney. Continue other medication at previous doses.  Measure Sweeney sugars outside of the office fasting, or 2 hours after meals, and bring a record of these to your next visit. Follow-up in 3 weeks, sooner if any new or worsening symptoms.    I personally performed the services described in this documentation, which was scribed in my presence. The recorded information has been reviewed and considered, and addended by me as needed.   Signed,   Robert Ray, MD [facility].  01/17/2016 11:26 AM

## 2016-02-05 ENCOUNTER — Ambulatory Visit: Payer: Self-pay | Admitting: Internal Medicine

## 2016-02-09 ENCOUNTER — Other Ambulatory Visit: Payer: Worker's Compensation

## 2016-03-01 ENCOUNTER — Ambulatory Visit (INDEPENDENT_AMBULATORY_CARE_PROVIDER_SITE_OTHER): Payer: Worker's Compensation | Admitting: Physician Assistant

## 2016-03-01 ENCOUNTER — Encounter: Payer: Self-pay | Admitting: Physician Assistant

## 2016-03-01 VITALS — BP 160/80 | HR 71 | Temp 98.0°F | Resp 18 | Ht 75.0 in | Wt 258.8 lb

## 2016-03-01 DIAGNOSIS — Z794 Long term (current) use of insulin: Secondary | ICD-10-CM

## 2016-03-01 DIAGNOSIS — E1165 Type 2 diabetes mellitus with hyperglycemia: Secondary | ICD-10-CM | POA: Diagnosis not present

## 2016-03-01 DIAGNOSIS — IMO0001 Reserved for inherently not codable concepts without codable children: Secondary | ICD-10-CM

## 2016-03-01 DIAGNOSIS — I10 Essential (primary) hypertension: Secondary | ICD-10-CM

## 2016-03-01 LAB — BASIC METABOLIC PANEL WITH GFR
BUN: 23 mg/dL (ref 7–25)
Potassium: 3.7 mmol/L (ref 3.5–5.3)
Sodium: 139 mmol/L (ref 135–146)

## 2016-03-01 LAB — BASIC METABOLIC PANEL
CO2: 29 mmol/L (ref 20–31)
Calcium: 9.8 mg/dL (ref 8.6–10.3)
Chloride: 100 mmol/L (ref 98–110)
Creat: 1.12 mg/dL (ref 0.70–1.33)
Glucose, Bld: 228 mg/dL — ABNORMAL HIGH (ref 65–99)

## 2016-03-01 LAB — GLUCOSE, POCT (MANUAL RESULT ENTRY): POC Glucose: 253 mg/dL — AB (ref 70–99)

## 2016-03-01 NOTE — Patient Instructions (Addendum)
Today your BP is 160/80, which is higher than our target goal.  Increase your dose of Chlorithalidone to 25 mg a day. That means take a whole pill, not half.  Increase your dose Farxigia to 10 mg a day.   - Go buy lancets and blood sugar strips at your pharmacy. Check your blood sugar at home episodically, including fasting, and 2 hours after meals, and recheck in 3 weeks.  - Try and check your [facility] a [facility] or equivalent once a week.  - Please keep a written record of all these values along with the dates.    IF you received an x-ray today, you will receive an invoice from [facility]. Please contact [facility] at [phone] with questions or concerns regarding your invoice.   IF you received labwork today, you will receive an invoice from [facility]. Please contact [facility] at [phone] with questions or concerns regarding your invoice.   Our billing staff will not be able to assist you with questions regarding bills from these companies.  You will be contacted with the lab results as soon as they are available. The fastest way to get your results is to activate your My Chart account. Instructions are located on the last page of this paperwork. If you have not heard from us regarding the results in 2 weeks, please contact this office.     Diabetes Mellitus and Food It is important for you to manage your blood sugar (glucose) level. Your blood glucose level can be greatly affected by what you eat. Eating healthier foods in the appropriate amounts throughout the day at about the same time each day will help you control your blood glucose level. It can also help slow or prevent worsening of your diabetes mellitus. Healthy eating may even help you improve the level of your blood pressure and reach or maintain a healthy weight.  General recommendations for healthful eating and cooking habits include:  Eating meals and snacks regularly.  Avoid going long periods of time without eating to lose weight.  Eating a diet that consists mainly of plant-based foods, such as fruits, vegetables, nuts, legumes, and whole grains.  Using low-heat cooking methods, such as baking, instead of high-heat cooking methods, such as deep frying. Work with your dietitian to make sure you understand how to use the Nutrition Facts information on food labels. HOW CAN FOOD AFFECT ME? Carbohydrates Carbohydrates affect your blood glucose level more than any other type of food. Your dietitian will help you determine how many carbohydrates to eat at each meal and teach you how to count carbohydrates. Counting carbohydrates is important to keep your blood glucose at a healthy level, especially if you are using insulin or taking certain medicines for diabetes mellitus. Alcohol Alcohol can cause sudden decreases in blood glucose (hypoglycemia), especially if you use insulin or take certain medicines for diabetes mellitus. Hypoglycemia can be a life-threatening condition. Symptoms of hypoglycemia (sleepiness, dizziness, and disorientation) are similar to symptoms of having too much alcohol.  If your health care provider has given you approval to drink alcohol, do so in moderation and use the following guidelines:  Women should not have more than one drink per day, and men should not have more than two drinks per day. One drink is equal to:  12 oz of beer.  5 oz of wine.  1 oz of hard liquor.  Do not drink on an empty stomach.  Keep yourself hydrated. Have water, diet  soda, or unsweetened iced tea.  Regular soda, juice, and other mixers might contain a lot of carbohydrates and should be counted. WHAT FOODS ARE NOT RECOMMENDED? As you make food choices, it is important to remember that all foods are not the same. Some foods have fewer nutrients per serving than other foods, even though they might have the same number of calories or carbohydrates. It is  difficult to get your body what it needs when you eat foods with fewer nutrients. Examples of foods that you should avoid that are high in calories and carbohydrates but low in nutrients include:  Trans fats (most processed foods list trans fats on the Nutrition Facts label).  Regular soda.  Juice.  Candy.  Sweets, such as cake, pie, doughnuts, and cookies.  Fried foods. WHAT FOODS CAN I EAT? Eat nutrient-rich foods, which will nourish your body and keep you healthy. The food you should eat also will depend on several factors, including:  The calories you need.  The medicines you take.  Your weight.  Your blood glucose level.  Your blood pressure level.  Your cholesterol level. You should eat a variety of foods, including:  Protein.  Lean cuts of meat.  Proteins low in saturated fats, such as fish, egg whites, and beans. Avoid processed meats.  Fruits and vegetables.  Fruits and vegetables that may help control blood glucose levels, such as apples, mangoes, and yams.  Dairy products.  Choose fat-free or low-fat dairy products, such as milk, yogurt, and cheese.  Grains, bread, pasta, and rice.  Choose whole grain products, such as multigrain bread, whole oats, and brown rice. These foods may help control blood pressure.  Fats.  Foods containing healthful fats, such as nuts, avocado, olive oil, canola oil, and fish. DOES EVERYONE WITH DIABETES MELLITUS HAVE THE SAME MEAL PLAN? Because every person with diabetes mellitus is different, there is not one meal plan that works for everyone. It is very important that you meet with a dietitian who will help you create a meal plan that is just right for you.   This information is not intended to replace advice given to you by your health care provider. Make sure you discuss any questions you have with your health care provider.   Document Released: 03/21/2005 Document Revised: 07/15/2014 Document Reviewed:  05/21/2013 Elsevier Interactive Patient Education 2016 [employer].  Diabetes and Exercise Exercising regularly is important. It is not just about losing weight. It has many health benefits, such as:  Improving your overall fitness, flexibility, and endurance.  Increasing your bone density.  Helping with weight control.  Decreasing your body fat.  Increasing your muscle strength.  Reducing stress and tension.  Improving your overall health. People with diabetes who exercise gain additional benefits because exercise:  Reduces appetite.  Improves the body's use of blood sugar (glucose).  Helps lower or control blood glucose.  Decreases blood pressure.  Helps control blood lipids (such as cholesterol and triglycerides).  Improves the body's use of the hormone insulin by:  Increasing the body's insulin sensitivity.  Reducing the body's insulin needs.  Decreases the risk for heart disease because exercising:  Lowers cholesterol and triglycerides levels.  Increases the levels of good cholesterol (such as high-density lipoproteins [HDL]) in the body.  Lowers blood glucose levels. YOUR ACTIVITY PLAN  Choose an activity that you enjoy, and set realistic goals. To exercise safely, you should begin practicing any new physical activity slowly, and gradually increase the intensity of the exercise  over time. Your health care provider or diabetes educator can help create an activity plan that works for you. General recommendations include:  Encouraging children to engage in at least 60 minutes of physical activity each day.  Stretching and performing strength training exercises, such as yoga or weight lifting, at least 2 times per week.  Performing a total of at least 150 minutes of moderate-intensity exercise each week, such as brisk walking or water aerobics.  Exercising at least 3 days per week, making sure you allow no more than 2 consecutive days to pass without  exercising.  Avoiding long periods of inactivity (90 minutes or more). When you have to spend an extended period of time sitting down, take frequent breaks to walk or stretch. RECOMMENDATIONS FOR EXERCISING WITH TYPE 1 OR TYPE 2 DIABETES   Check your blood glucose before exercising. If blood glucose levels are greater than 240 mg/dL, check for urine ketones. Do not exercise if ketones are present.  Avoid injecting insulin into areas of the body that are going to be exercised. For example, avoid injecting insulin into:  The arms when playing tennis.  The legs when jogging.  Keep a record of:  Food intake before and after you exercise.  Expected peak times of insulin action.  Blood glucose levels before and after you exercise.  The type and amount of exercise you have done.  Review your records with your health care provider. Your health care provider will help you to develop guidelines for adjusting food intake and insulin amounts before and after exercising.  If you take insulin or oral hypoglycemic agents, watch for signs and symptoms of hypoglycemia. They include:  Dizziness.  Shaking.  Sweating.  Chills.  Confusion.  Drink plenty of water while you exercise to prevent dehydration or heat stroke. Body water is lost during exercise and must be replaced.  Talk to your health care provider before starting an exercise program to make sure it is safe for you. Remember, almost any type of activity is better than none.   This information is not intended to replace advice given to you by your health care provider. Make sure you discuss any questions you have with your health care provider.   Document Released: 09/14/2003 Document Revised: 11/08/2014 Document Reviewed: 12/01/2012 Elsevier Interactive Patient Education [employer]

## 2016-03-01 NOTE — Progress Notes (Signed)
Robert Sweeney  MRN: [id number] DOB: 1961/06/12  PCP: No PCP Per Patient  Subjective:  Pt is a 55 year old male presenting to clinic for follow-up worker's comp left knee surgery.  Robert Sweeney is trying to get repeat knee surgery by Dr. Tonita Cong, originally scheduled for March 2017 but was denied due to elevated blood pressure and blood glucose. 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 He has had a difficult time controlling his blood pressure and blood sugar, despite multiple visits to [facility] as well as cardiology.  05/02 - BP: 152/80  - Dr. Everlene Farrier initiated Losartan '50mg'$  qd and Lipitor '10mg'$  qd.  06/02 - Bp: 158/86, Glucose 243 -  Sarah Weber increased Losartan to '100mg'$  qd. Increased glucophage to '1000mg'$  bid 06/22 - Bp: 156/84, Glucose 226  -  Dr. [name] added chlorthalidone 12.'5mg'$  qd, continue Losartan '100mg'$  qd.  Added Januvai 100 mg qd, continue metformin '1000mg'$  bid.  07/12 - Bp: 140/80, Glucose 189 - Dr. Carlota Raspberry added Wilder Glade '5mg'$  qd for his continued elevated blood sugar.   Today BP 160/80  He does not check his blood pressure or blood sugar at home. He does not exercise, eats a lot of meat, some veggies.  Patient seems confused and fed-up by the amount of medications he is taking.  Patient reports he is not taking medications at the same time every day, and he is taking them all at once.  Reports light-headedness, headaches, changes in mood.   Patient has a history of DM however states it has been controlled with diet and exercise. He has had life stressors that changed his lifestyle and financial situation and states he has had a difficult time coping.    Review of Systems  Constitutional: Negative.   Respiratory: Negative.   Cardiovascular: Negative.   Gastrointestinal: Negative for abdominal pain, constipation, diarrhea, nausea and vomiting.  Genitourinary: Negative.   Neurological: Positive for dizziness, light-headedness and headaches. Negative for syncope and  weakness.  Psychiatric/Behavioral: Positive for agitation. Negative for confusion and sleep disturbance. The patient is nervous/anxious.     Patient Active Problem List   Diagnosis Date Noted  . Abnormal nuclear stress test 01/12/2016  . Preprocedural examination 01/12/2016  . Blood clotting disorder ([place]) 01/12/2016  . Other fatigue 01/12/2016  . Preoperative cardiovascular examination 12/06/2015  . Diabetes ([place]) 11/07/2015  . Essential hypertension 11/07/2015  . Nonspecific abnormal electrocardiogram (ECG) (EKG) 11/07/2015  . Tear of tendon of lower extremity 09/22/2014    Current Outpatient Prescriptions on File Prior to Visit  Medication Sig Dispense Refill  . aspirin EC 325 MG tablet Take 325 mg by mouth daily. Reported on 11/07/2015    . atorvastatin (LIPITOR) 20 MG tablet Take 1 tablet (20 mg total) by mouth daily. 30 tablet 11  . b complex vitamins tablet Take 1 tablet by mouth daily. Reported on 11/07/2015    . BEE POLLEN PO Take 1 capsule by mouth daily. Reported on 11/07/2015    . blood glucose meter kit and supplies KIT Dispense based on patient and insurance preference. Check once a day at differing times 1 each 0  . chlorthalidone (HYGROTON) 25 MG tablet Take 0.5 tablets (12.5 mg total) by mouth daily. 30 tablet 3  . Cinnamon 500 MG TABS Take 1 tablet by mouth daily. Reported on 11/07/2015    . Coenzyme Q10 (CO Q 10 PO) Take 1 capsule by mouth daily. Reported on 11/07/2015    . dapagliflozin propanediol (FARXIGA) 5  MG TABS tablet Take 5 mg by mouth daily. 30 tablet 1  . docusate sodium (COLACE) 100 MG capsule Take 1 capsule (100 mg total) by mouth 2 (two) times daily as needed for mild constipation. 20 capsule 1  . losartan (COZAAR) 100 MG tablet Take 1 tablet (100 mg total) by mouth daily. 30 tablet 5  . metFORMIN (GLUCOPHAGE) 500 MG tablet Take 2 tablets (1,000 mg total) by mouth 2 (two) times daily with a meal. 60 tablet 11  . Multiple Vitamin (MULTIVITAMIN WITH MINERALS) TABS  tablet Take 1 tablet by mouth every morning. Reported on 11/07/2015    . Nutritional Supplements (DHEA PO) Take 1 capsule by mouth daily. Reported on 11/07/2015    . OVER THE COUNTER MEDICATION Take 1 tablet by mouth daily. Reported on 11/07/2015    . OVER THE COUNTER MEDICATION Take 1 tablet by mouth daily. Reported on 11/07/2015    . oxyCODONE-acetaminophen (PERCOCET) 7.5-325 MG tablet Take 1 tablet by mouth every 4 (four) hours as needed. (pain)  0  . sitaGLIPtin (JANUVIA) 100 MG tablet Take 1 tablet (100 mg total) by mouth daily. 30 tablet 6  . zinc gluconate 50 MG tablet Take 50 mg by mouth daily. Reported on 11/07/2015     No current facility-administered medications on file prior to visit.     No Known Allergies  Objective:  BP (!) 160/80 (BP Location: Right Arm, Patient Position: Sitting, Cuff Size: Large)   Pulse 71   Temp 98 F (36.7 C)   Resp 18   Ht 6\' 3"  (1.905 m)   Wt 258 lb 12.8 oz (117.4 kg)   SpO2 96%   BMI 32.35 kg/m   Physical Exam  Constitutional: He is oriented to person, place, and time and well-developed, well-nourished, and in no distress. No distress.  Cardiovascular: Normal rate, regular rhythm, normal heart sounds and intact distal pulses.   Pulmonary/Chest: Effort normal and breath sounds normal. No respiratory distress. He has no wheezes.  Neurological: He is alert and oriented to person, place, and time. GCS score is 15.  Skin: Skin is warm and dry.  Psychiatric: Mood, memory, affect and judgment normal.  Vitals reviewed.   Results for orders placed or performed in visit on 03/01/16  Basic metabolic panel  Result Value Ref Range   Sodium 139 135 - 146 mmol/L   Potassium 3.7 3.5 - 5.3 mmol/L   Chloride 100 98 - 110 mmol/L   CO2 29 20 - 31 mmol/L   Glucose, Bld 228 (H) 65 - 99 mg/dL   BUN 23 7 - 25 mg/dL   Creat 03/03/16 1.49 - 2.98 mg/dL   Calcium 9.8 8.6 - 8.21 mg/dL  POCT glucose (manual entry)  Result Value Ref Range   POC Glucose 253 (A) 70 - 99  mg/dl    Prior office visit results personally reviewed:  Evaluated by Dr. 36.5, cardiology 07/07, at which time they found discrepant findings on his nuclear stress test and echo.  Cardiology plans to schedule him for a left heart cath in the near future.   Assessment and Plan :  1. Uncontrolled type 2 diabetes mellitus without complication, with long-term current use of insulin (HCC) - POCT glucose (manual entry) - Basic metabolic panel -Will increase Farxigia to 10 mg a day. - Instructed patient to buy lancets and sugar strips at his pharmacy. Encouraged to check his sugars episodicly, including in the morning and 2 hours after meal.   2. Essential hypertension -  Increase Chlorathalidone to '25mg'$  a day.  - Return to [facility] for sugar and blood pressure recheck in 2 weeks.   - Next visit check A1C (needs to be <8 for his surgery) - Patient is meeting with ortho surgeon Aug 31.  - He was evaluated by cardiology 07/07.  Cardiology plans to schedule left heart cath in the near future. Encourage to keep appointment.   - Had in-depth conversation with patient's wife and case worker, over 30 minutes, discussing his medication regimen, diet, lifetstyle.  Wife states no one has told them which medication to take when and they are very frustrated.  Spent time discussing and planning daily medication schedule with wife.  Printed out and discussed diet, exercise and lifestyle changes that would benefit Mr. Menges.  Wife agrees to help implement these changes. Mr. Choi had left the room by this time. - Case worker reports surgical clearance requires A1C to be <8. There is no blood glucose requirement.  Is unsure about the blood pressure requirement. Will bring that with her at next visit.    Mercer Pod, PA-C  [facility] 03/01/2016 9:27 AM

## 2016-03-07 ENCOUNTER — Telehealth: Payer: Self-pay

## 2016-03-07 NOTE — Telephone Encounter (Signed)
Annice PihJackie from [facility] is calling to speak to [place] on behalf of Dr. Shelle IronBeane in regards to managing the patients blood sugar and surgical clearance. Please call Annice PihJackie on her cell! [phone]

## 2016-03-18 NOTE — Telephone Encounter (Signed)
Have tried to call Robert Sweeney several times with no answer and no option to leave a voice mail.

## 2016-03-21 ENCOUNTER — Other Ambulatory Visit: Payer: Self-pay

## 2016-03-21 DIAGNOSIS — I10 Essential (primary) hypertension: Secondary | ICD-10-CM

## 2016-03-21 MED ORDER — CHLORTHALIDONE 25 MG PO TABS: 25.0000 mg | ORAL_TABLET | Freq: Every day | ORAL | 5 refills | Status: DC

## 2016-03-26 ENCOUNTER — Telehealth: Payer: Self-pay

## 2016-03-26 NOTE — Telephone Encounter (Signed)
PATIENT WOULD LIKE Robert Sweeney TO CALL HIM BACK REGARDING HIS WORKER'S COMPENSATION RECHECK [place]. HE HAS SOME QUESTIONS ABOUT THE NOTES SHE WROTE. BEST PHONE [phone] OR [phone].  MBC

## 2016-03-27 NOTE — Telephone Encounter (Signed)
Pt states he has an office visit scheduled for this Friday with [employer]

## 2016-03-29 ENCOUNTER — Other Ambulatory Visit: Payer: Self-pay | Admitting: Family Medicine

## 2016-03-29 ENCOUNTER — Ambulatory Visit (INDEPENDENT_AMBULATORY_CARE_PROVIDER_SITE_OTHER): Payer: Worker's Compensation | Admitting: Physician Assistant

## 2016-03-29 VITALS — BP 134/80 | HR 70 | Temp 98.0°F | Resp 16 | Ht 75.0 in | Wt 262.0 lb

## 2016-03-29 DIAGNOSIS — I10 Essential (primary) hypertension: Secondary | ICD-10-CM | POA: Diagnosis not present

## 2016-03-29 DIAGNOSIS — E1165 Type 2 diabetes mellitus with hyperglycemia: Principal | ICD-10-CM

## 2016-03-29 DIAGNOSIS — IMO0001 Reserved for inherently not codable concepts without codable children: Secondary | ICD-10-CM

## 2016-03-29 LAB — POCT GLYCOSYLATED HEMOGLOBIN (HGB A1C): Hemoglobin A1C: 7.7

## 2016-03-29 NOTE — Progress Notes (Signed)
   Robert BranchDavid A Wiley  MRN: [id number] DOB: 05-13-1961  PCP: No PCP Per Patient  Subjective:  Pt is a 55 year old male, history of diabetes and hypertension, who presents to clinic for worker's comp follow-up.  He originally suffered a left knee injury in 2016 and has been denied surgery multiple times since that time due to high blood pressure and elevated blood sugar despite [facility]. Today he is feeling well, no complaints. His goal is an A1C <8 for surgical clearance.  Hypertension - Controlled with Chlorathalidone 12.5 mg bid, losartan 100mg  qd. At his last appointment his BP was 160/80. Today it is 134/80. He noted he is eating much better and exercising regularly. At his last appointment here, he was instructed on when to take his medications, as this was a point of confusion in the past and he was taking all of his medications at once.  Contributed life stressors as a source of poor diet and exercise. He ran out of Chlorthalidone two weeks ago.   Diabetes - Farxiga 5mg , Metformin 500mg  BID, Januvia 100mg .  He checks blood sugars at home every few days and at different times of the day: 143, 122    Review of Systems  Respiratory: Negative.   Cardiovascular: Negative.   Gastrointestinal: Negative.   Neurological: Negative.    Medications, medical history and allergies reviewed with patient.   Objective:  BP 134/80   Pulse 70   Temp 98 F (36.7 C) (Oral)   Resp 16   Ht 6\' 3"  (1.905 m)   Wt 262 lb (118.8 kg)   SpO2 95%   BMI 32.75 kg/m   Physical Exam  Constitutional: He is oriented to person, place, and time and well-developed, well-nourished, and in no distress. No distress.  Cardiovascular: Normal rate, regular rhythm and normal heart sounds.   Pulmonary/Chest: Effort normal and breath sounds normal. No respiratory distress.  Neurological: He is alert and oriented to person, place, and time. GCS score is 15.  Skin: Skin is warm and dry.    Psychiatric: Mood, memory, affect and judgment normal.  Vitals reviewed.  Results for orders placed or performed in visit on 03/29/16  POCT glycosylated hemoglobin (Hb A1C)  Result Value Ref Range   Hemoglobin A1C 7.7     Assessment and Plan :  1. Uncontrolled type 2 diabetes mellitus without complication, without long-term current use of insulin (HCC) - POCT glycosylated hemoglobin (Hb A1C). 7.7 - Januvia 100mg  qd, Farxiga 10mg , Metformin 1000mg  bid - Encouraged patient to continue eating a healthy diet.  2. Essential hypertension - Chlorathalidone 12.5 mg, losartan 100mg  qd - Patient will RTC in one month for follow-up blood pressure check. He was confused as to whether to take 12.5mg  or 25 mg Chlorthalidone, then he ran out two weeks ago, however blood pressure is still within normal limits. He is instructed to take 12.5mg . He understands and agrees.  Marco CollieWhitney Amberleigh Gerken, PA-C  [facility] 03/29/2016 9:17 AM

## 2016-03-29 NOTE — Patient Instructions (Addendum)
Please return in one month for medication consultation and routine lab work. We will review any necessary changes as your blood pressure and blood sugar levels are improving. Great work!!     IF you received an x-ray today, you will receive an invoice from [facility]. Please contact [facility] at [phone] with questions or concerns regarding your invoice.   IF you received labwork today, you will receive an invoice from [facility]. Please contact [facility] at [phone] with questions or concerns regarding your invoice.   Our billing staff will not be able to assist you with questions regarding bills from these companies.  You will be contacted with the lab results as soon as they are available. The fastest way to get your results is to activate your My Chart account. Instructions are located on the last page of this paperwork. If you have not heard from us regarding the results in 2 weeks, please contact this office.

## 2016-04-06 ENCOUNTER — Ambulatory Visit: Payer: Self-pay

## 2016-04-19 ENCOUNTER — Other Ambulatory Visit: Payer: Self-pay | Admitting: Family Medicine

## 2016-04-19 DIAGNOSIS — IMO0001 Reserved for inherently not codable concepts without codable children: Secondary | ICD-10-CM

## 2016-04-19 DIAGNOSIS — E1165 Type 2 diabetes mellitus with hyperglycemia: Principal | ICD-10-CM

## 2016-04-21 NOTE — Telephone Encounter (Signed)
Patient needs a refill of Farxuga, he was getting 5mg  is his prescription being increased to 10mg ?

## 2016-04-22 NOTE — Telephone Encounter (Signed)
Yes, his dose is 10mg . I filled one more Rx. Please remind patient he is to come in for blood sugar and blood pressure recheck in the next few weeks. I'd like to make sure he is still doing "such an awesome job!" on these medications. We will then be able to extend his visits to every three months... Six months, etc.  Thank you!

## 2016-04-23 ENCOUNTER — Telehealth: Payer: Self-pay

## 2016-04-23 NOTE — Telephone Encounter (Signed)
Patient is calling because the refill request for chlorthalidone was not responded to. I informed patient that theres no refill request received. Please advise! [facility] [address]

## 2016-04-25 NOTE — Telephone Encounter (Signed)
LMOM for pt that 5 RFs were put on the Rx sent in last month, so he can call the pharm to have them fill it, which may be why we did not get a req from pharmacy. Also, advised that if he is not out, he may want to wait for his appt tomorrow to make sure the dose doesn't change.

## 2016-04-26 ENCOUNTER — Telehealth: Payer: Self-pay | Admitting: Internal Medicine

## 2016-04-26 ENCOUNTER — Ambulatory Visit (INDEPENDENT_AMBULATORY_CARE_PROVIDER_SITE_OTHER): Payer: Worker's Compensation | Admitting: Physician Assistant

## 2016-04-26 VITALS — BP 142/84 | HR 64 | Temp 98.2°F | Ht 75.0 in | Wt 265.0 lb

## 2016-04-26 DIAGNOSIS — Z01818 Encounter for other preprocedural examination: Secondary | ICD-10-CM

## 2016-04-26 DIAGNOSIS — I10 Essential (primary) hypertension: Secondary | ICD-10-CM | POA: Diagnosis not present

## 2016-04-26 DIAGNOSIS — E119 Type 2 diabetes mellitus without complications: Secondary | ICD-10-CM | POA: Diagnosis not present

## 2016-04-26 DIAGNOSIS — R9439 Abnormal result of other cardiovascular function study: Secondary | ICD-10-CM

## 2016-04-26 LAB — COMPLETE METABOLIC PANEL WITH GFR
ALT: 17 U/L (ref 9–46)
AST: 16 U/L (ref 10–35)
Albumin: 3.8 g/dL (ref 3.6–5.1)
Alkaline Phosphatase: 100 U/L (ref 40–115)
BUN: 21 mg/dL (ref 7–25)
CO2: 31 mmol/L (ref 20–31)
Creat: 0.89 mg/dL (ref 0.70–1.33)
GFR, Est African American: >89 mL/min (ref >=60)
GFR, Est Non African American: >89 mL/min (ref >=60)
Glucose, Bld: 233 mg/dL — ABNORMAL HIGH (ref 65–99)
Sodium: 137 mmol/L (ref 135–146)
Total Bilirubin: 0.7 mg/dL (ref 0.2–1.2)
Total Protein: 6.5 g/dL (ref 6.1–8.1)

## 2016-04-26 LAB — CBC
HCT: 42.1 % (ref 38.5–50.0)
Hemoglobin: 13.8 g/dL (ref 13.2–17.1)
MCH: 30.3 pg (ref 27.0–33.0)
MCHC: 32.8 g/dL (ref 32.0–36.0)
MCV: 92.5 fL (ref 80.0–100.0)
MPV: 10.8 fL (ref 7.5–12.5)
Platelets: 234 10*3/uL (ref 140–400)
RBC: 4.55 MIL/uL (ref 4.20–5.80)
RDW: 11.9 % (ref 11.0–15.0)
WBC: 7.1 10*3/uL (ref 3.8–10.8)

## 2016-04-26 LAB — COMPLETE METABOLIC PANEL WITHOUT GFR
Calcium: 8.9 mg/dL (ref 8.6–10.3)
Chloride: 98 mmol/L (ref 98–110)
Potassium: 3.4 mmol/L — ABNORMAL LOW (ref 3.5–5.3)

## 2016-04-26 MED ORDER — DAPAGLIFLOZIN PROPANEDIOL 10 MG PO TABS: 10.0000 mg | ORAL_TABLET | Freq: Every day | ORAL | 4 refills | Status: AC

## 2016-04-26 NOTE — Telephone Encounter (Signed)
Spoke with renee, read the note in the patients chart from 01-12-16 regarding heart cath.

## 2016-04-26 NOTE — Patient Instructions (Addendum)
I am terribly sorry to say that I cannot clear you for your surgery today. You need to have a cardiac catheterization performed. There appears to be a discrepancy in who will pay for this procedure. This is something I cannot help you with today and needs to be figured out from an insurance standpoint.  I have made a referral to cardiology and they will call you to schedule an appointment.    IF you received an x-ray today, you will receive an invoice from [facility]. Please contact [facility] at [phone] with questions or concerns regarding your invoice.   IF you received labwork today, you will receive an invoice from [facility]. Please contact [facility] at [phone] with questions or concerns regarding your invoice.   Our billing staff will not be able to assist you with questions regarding bills from these companies.  You will be contacted with the lab results as soon as they are available. The fastest way to get your results is to activate your My Chart account. Instructions are located on the last page of this paperwork. If you have not heard from us regarding the results in 2 weeks, please contact this office.

## 2016-04-26 NOTE — Telephone Encounter (Signed)
New message      Pt was referred to us in July for a cath.  Pt told urgent care that we cancelled the cath.  They are calling to find out why cath was cancelled.  Please call

## 2016-04-26 NOTE — Progress Notes (Signed)
Robert Sweeney Nov 07, 1960 55 y.o.   Chief Complaint  Patient presents with  . Surgical Clearance    Knee surgery     History of Present Illness:  Presents for evaluation of work-related complaint. He is here once again for surgical clearance. He was here last month for the same. At his last appointment his A1C was within normal limits as well as his blood pressure.  Feeling well today, no complaints. States he is taking his medications as prescribed.   HTN - Controlled with Chlorathalidone 12.5 mg bid, losartan '100mg'$  qd. He notes he is eating better and exercising regularly.   Diabetes - Controlled with Farxiga '5mg'$ , Januvia '100mg'$ . Needs refill of Farxiga today. Checks blood sugars at home.   Review of Systems  Constitutional: Negative for chills, diaphoresis and weight loss.  Respiratory: Negative for cough, shortness of breath and wheezing.   Cardiovascular: Negative for chest pain, palpitations, claudication and leg swelling.  Gastrointestinal: Negative for abdominal pain, diarrhea, heartburn, nausea and vomiting.  Neurological: Negative for dizziness, tingling and weakness.    No Known Allergies  Current medications reviewed and updated. Past medical history, family history, social history have been reviewed and updated.   Physical Exam  Constitutional: He is oriented to person, place, and time and well-developed, well-nourished, and in no distress. No distress.  Cardiovascular: Normal rate, regular rhythm and normal heart sounds.   Pulmonary/Chest: He is in respiratory distress.  Neurological: He is alert and oriented to person, place, and time. GCS score is 15.  Skin: Skin is warm and dry.  Psychiatric: Mood, memory, affect and judgment normal.  Vitals reviewed.   12/29/2015 Echocardiogram reviewed: LV EF 60-65%.  Left Ventricle: Mildly dilated left ventricle with moderate concentric hypertrophy. Normal systolic function. Normal wall motion, no regional wall motion  abnormalities. Doppler parameters consistent with both elevated ventricular end-diastolic filling pressure and elevated atrial filling pressure.  Trivial regurgitation of aortic valve. Moderately dilated left atrium. No atrial septal defect or PFO.   12/22/2015 Cardiac Stress Test:  LVEF moderately decreased (30-44%) Nuclear stress EF: 39% No ST segment deviation noted during stress Findings consistent with prior myocardial infarction with peri-infarct ischemia LVH: Inferobasal wall infarction with peri infarct ischemia involving the inferior and inferior lateral wall at mid and basal level  EF 39% with diffuse hypokinesis No arrhythmia during stress and recovery No significant arrhythmias noted during test.   EKG- No changes from EKG 6 months ago performed at [facility] by Dr. Everlene Farrier. Since that time, he was referred to Cardiology for an Echo.   Assessment and Plan: 1. Essential hypertension 2. Preoperative clearance - CBC - COMPLETE METABOLIC PANEL WITH GFR - EKG 12-Lead - Ambulatory referral to Cardiology  3. Type 2 diabetes mellitus without complication, without long-term current use of insulin (HCC) - dapagliflozin propanediol (FARXIGA) 10 MG TABS tablet; Take 10 mg by mouth daily.  Dispense: 30 tablet; Refill: 4  4. Abnormal cardiovascular stress test - Ambulatory referral to Cardiology - It is my opinion that I cannot medically clear this patient for knee surgery due to his abnormal stress tests. He has met his A1C and blood pressure goals, however Robert Sweeney should be further evaluated and cleared for surgery by cardiology. Will refer   Robert Pod, PA-C  [facility] 04/26/2016 9:23 AM

## 2016-04-30 ENCOUNTER — Telehealth: Payer: Self-pay

## 2016-05-06 ENCOUNTER — Encounter: Payer: Self-pay | Admitting: Physician Assistant

## 2016-05-10 ENCOUNTER — Telehealth: Payer: Self-pay

## 2016-05-10 NOTE — Telephone Encounter (Signed)
Spoke with w/c Water engineerclaims manager // states that patient needs to follow up with ortho to determine if surgery is necessary. If so, they will proceed with cardio/cath visit. They will handle follow up. There is no notation of referral to ortho on our end in pts chart.

## 2016-05-25 ENCOUNTER — Other Ambulatory Visit: Payer: Self-pay | Admitting: Physician Assistant

## 2016-05-25 DIAGNOSIS — IMO0001 Reserved for inherently not codable concepts without codable children: Secondary | ICD-10-CM

## 2016-05-25 DIAGNOSIS — E1165 Type 2 diabetes mellitus with hyperglycemia: Principal | ICD-10-CM

## 2016-06-13 ENCOUNTER — Other Ambulatory Visit: Payer: Self-pay

## 2016-06-13 MED ORDER — ATORVASTATIN CALCIUM 20 MG PO TABS: 20.0000 mg | ORAL_TABLET | Freq: Every day | ORAL | 1 refills | Status: DC

## 2016-06-13 NOTE — Telephone Encounter (Signed)
Fax req to restart Atorvastatin. Ordered 11/2015 #30 - 11RF.  Discussed with McVey, Whitney  Reordered #90 with 1 RF [facility] [address]

## 2016-07-10 ENCOUNTER — Ambulatory Visit: Payer: Self-pay | Admitting: Orthopedic Surgery

## 2016-07-23 ENCOUNTER — Encounter (HOSPITAL_COMMUNITY): Payer: Self-pay | Admitting: *Deleted

## 2016-07-26 ENCOUNTER — Ambulatory Visit: Payer: Self-pay | Admitting: Orthopedic Surgery

## 2016-07-26 NOTE — H&P (Signed)
Robert BranchDavid [name] is an 56 y.o. male.   Chief Complaint: Left knee pain HPI: The patient is a 56 year old male who presents today for follow up of their knee. The patient is being followed for their left quadriceps tendon repair. They are now 20 1/2 months out from surgery. The patient is 1 year, 11 1/2 months out from the injury (DOI 06/21/2014). Symptoms reported today include: pain. Current treatment includes: activity modification and pain medications. The following medication has been used for pain control: Percocet. The patient presents today following MRI. Note for "Follow-up Knee": The patient is out of work. NCM [name].  Robert Sweeney follows up with [name], his case manager. He has had his MRI repeated and it does show a probable meniscus tear medially. 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 Quad tendon shows focus of increased signal in the distal tendon consistent with a low-grade partial tear.  Tendinosis of the proximal patellar tendon is noted as well with a chronic Osgood-Schlatterus ossicle. He reports continued pain in the quadriceps, locking, giving way. He is taking one to two Percocet a day. He has not taken it from any other individual. He is not somnolent from that. He is not combining that with other medications. He indicates without that he is unable to sleep.  He also indicates his A1c has been normalized. He has clearance from his medical physician. We do not have cardiac clearance. Apparently he had two stress tests, one questionable and another one fine.  Past Medical History:  Diagnosis Date  . Diabetes mellitus without complication (HCC)    type 2  . Headache    sometimes  . History of diabetes mellitus, type II    diet controlled-"no medications needed anymore"  . Hypertension    under control through diet and exercise    Past Surgical History:  Procedure Laterality Date  . NO PAST SURGERIES    . QUADRICEPS TENDON REPAIR Left  09/22/2014   Procedure: REPAIR LEFT QUADRICEP TENDON with patch graft;  Surgeon: [name], MD;  Location: [facility];  Service: Orthopedics;  Laterality: Left;    Family History  Problem Relation Age of Onset  . Diabetes Mother   . Hypertension Mother    Social History:  reports that he quit smoking about 34 years ago. His smoking use included Cigarettes. He has never used smokeless tobacco. He reports that he drinks alcohol. He reports that he does not use drugs.  Allergies: No Known Allergies   (Not in a hospital admission)  No results found for this or any previous visit (from the past 48 hour(s)). No results found.  Review of Systems  Constitutional: Negative.   HENT: Negative.   Eyes: Negative.   Respiratory: Negative.   Cardiovascular: Negative.   Gastrointestinal: Negative.   Genitourinary: Negative.   Musculoskeletal: Positive for joint pain.  Skin: Negative.   Neurological: Negative.   Psychiatric/Behavioral: Negative.     There were no vitals taken for this visit. Physical Exam  Constitutional: He is oriented to person, place, and time. He appears well-developed.  HENT:  Head: Normocephalic.  Eyes: Pupils are equal, round, and reactive to light.  Neck: Normal range of motion.  Cardiovascular: Normal rate.   Respiratory: Effort normal.  GI: Soft.  Musculoskeletal:  On exam, he is tender along the distal quadriceps tendon, fairly severely. He is able to perform an active straight leg raise, however. He is tender along the medial joint line. Equivocal McMmurray.  Trace effusion.  Knee exam on inspection reveals no evidence of soft tissue swelling, ecchymosis, deformity or erythema. On palpation there is no tenderness in the medial and lateral joint line. No patellofemoral pain with compression. Nontender over the fibular head or the peroneal nerve. The range of motion was full. Provocative maneuvers revealed a negative Lachman, negative anterior and posterior  drawer. No instability was noted with varus and valgus stressing at 0 or 30 degrees. On manual motor test the quadriceps and hamstrings were 5/5. Sensory exam was intact to light touch.  Neurological: He is alert and oriented to person, place, and time.  Skin: Skin is warm and dry.     Assessment/Plan 1. 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 2. Probable symptomatic medial meniscus tear. 3. Diabetes, reportedly well under control. 4. Questionable cardiac history.  At this point in time, we discussed the options again proceeding with surgical intervention. The MRI update shows no significant changes from the previous, so our previous recommendation stand.  We discussed the surgery again in detail, the patch graft, postoperative recovery, arthroscopy prior to that to evaluate for meniscal pathology and the patellofemoral arthrosis and the importance to optimize his blood glucose postoperatively as that is an indicator of healing. Previously he had suboptimal blood glucose control and I believe that may have contributed to his re-tear.  I would recommend reconciliation of his cardiac condition. He still has not seen a cardiologist. Apparently, there is a barrier to that. One other option maybe to have a preoperative consultation with anesthesia to further discuss his current symptoms, condition and cardiac risk to determine whether any additional studies need to be performed and then we will proceed accordingly. Continue his out of work. I discussed this in front of the patient with [name] his case manager indicating the pathology, relevant anatomy and treatment.  We spent up to four minutes discussing all of these issues separately and in front of the patient with [name], case manager. Hopefully, we can proceed in a timely fashion. We discussed postoperative course, three months to maximum medical improvement engaging in physical therapy. I refilled his Percocet, again to be used  sparingly, mainly at night. Again assured me he is not receiving it from another source. He is not somnolent secondary to that. This is well tolerated.  Plan Left knee arthroscopy, partial medial meniscectomy, debridement, revision quad tendon repair with patch graft  Dorothy Spark., PA-C for Dr. Shelle Iron 07/26/2016, 11:38 AM

## 2016-08-05 ENCOUNTER — Other Ambulatory Visit: Payer: Self-pay

## 2016-08-05 MED ORDER — LOSARTAN POTASSIUM 100 MG PO TABS: 100.0000 mg | ORAL_TABLET | Freq: Every day | ORAL | 3 refills | Status: AC

## 2016-08-05 NOTE — Telephone Encounter (Signed)
04/2016 last ov for htn

## 2016-08-09 ENCOUNTER — Encounter (HOSPITAL_COMMUNITY)
Admission: RE | Admit: 2016-08-09 | Discharge: 2016-08-09 | Disposition: A | Discharge status: Home / Self Care | Payer: Worker's Compensation | Source: Ambulatory Visit | Attending: Specialist | Admitting: Specialist

## 2016-08-09 ENCOUNTER — Encounter (HOSPITAL_COMMUNITY): Payer: Self-pay

## 2016-08-09 ENCOUNTER — Encounter (HOSPITAL_COMMUNITY): Payer: Self-pay | Admitting: *Deleted

## 2016-08-09 DIAGNOSIS — Z01810 Encounter for preprocedural cardiovascular examination: Secondary | ICD-10-CM | POA: Insufficient documentation

## 2016-08-09 DIAGNOSIS — I10 Essential (primary) hypertension: Secondary | ICD-10-CM | POA: Insufficient documentation

## 2016-08-09 DIAGNOSIS — Z01812 Encounter for preprocedural laboratory examination: Secondary | ICD-10-CM | POA: Diagnosis present

## 2016-08-09 LAB — BASIC METABOLIC PANEL
ANION GAP: 9 (ref 5–15)
BUN: 27 mg/dL — ABNORMAL HIGH (ref 6–20)
CALCIUM: 9.1 mg/dL (ref 8.9–10.3)
CHLORIDE: 99 mmol/L — AB (ref 101–111)
CO2: 30 mmol/L (ref 22–32)
Creatinine, Ser: 1.01 mg/dL (ref 0.61–1.24)
GFR calc non Af Amer: >60 mL/min (ref >60)
GLUCOSE: 161 mg/dL — AB (ref 65–99)
Potassium: 3.3 mmol/L — ABNORMAL LOW (ref 3.5–5.1)
Sodium: 138 mmol/L (ref 135–145)

## 2016-08-09 LAB — CBC
HEMATOCRIT: 44 % (ref 39.0–52.0)
HEMOGLOBIN: 14.2 g/dL (ref 13.0–17.0)
MCH: 29.8 pg (ref 26.0–34.0)
MCHC: 32.3 g/dL (ref 30.0–36.0)
MCV: 92.2 fL (ref 78.0–100.0)
Platelets: 222 10*3/uL (ref 150–400)
RBC: 4.77 MIL/uL (ref 4.22–5.81)
RDW: 12.1 % (ref 11.5–15.5)
WBC: 6.7 10*3/uL (ref 4.0–10.5)

## 2016-08-09 LAB — GLUCOSE, CAPILLARY: GLUCOSE-CAPILLARY: 186 mg/dL — AB (ref 65–99)

## 2016-08-09 NOTE — Pre-Procedure Instructions (Addendum)
Cardiac Clearance form states, "I recommend surgery be postponed until he undergoes a left heart catheterization."  Spoke with Dr. Michelle Piperssey in anesthesia who states pt needs full cardiac clearance before proceeding with surgery.  [facility] with explanation that pt must have full cardiac clearance.  Also left voice message for CircleSherry.  Spoke with Cordelia PenSherry.  She is aware that Anesthesia wants full cardiac clearance.

## 2016-08-09 NOTE — Patient Instructions (Addendum)
Robert BranchDavid [name]  08/09/2016   Your procedure is scheduled on: 08/15/16  [facility]  [facility] to 3rd floor to  Short Stay Center at 5:15 AM.  Call this number if you have problems the morning of surgery [phone]   Remember: ONLY 1 PERSON MAY GO WITH YOU TO SHORT STAY TO GET  READY MORNING OF YOUR SURGERY.  Do not eat food or drink liquids :After Midnight.     Take these medicines the morning of surgery with A SIP OF WATER: Ocycodone-acetaminophen if needed DO NOT TAKE ANY DIABETIC MEDICATIONS DAY OF YOUR SURGERY                               You may not have any metal on your body including hair pins and              piercings  Do not wear jewelry, make-up, lotions, powders or perfumes, deodorant             Do not wear nail polish.  Do not shave  48 hours prior to surgery.              Men may shave face and neck.   Do not bring valuables to the hospital. [facility] IS NOT             RESPONSIBLE   FOR VALUABLES.  Contacts, dentures or bridgework may not be worn into surgery.  Leave suitcase in the car. After surgery it may be brought to your room.              Please read over the following fact sheets you were given: _____________________________________________________________________             [facility] - Preparing for Surgery Before surgery, you can play an important role.  Because skin is not sterile, your skin needs to be as free of germs as possible.  You can reduce the number of germs on your skin by washing with CHG (chlorahexidine gluconate) soap before surgery.  CHG is an antiseptic cleaner which kills germs and bonds with the skin to continue killing germs even after washing. Please DO NOT use if you have an allergy to CHG or antibacterial soaps.  If your skin becomes reddened/irritated stop using the CHG and inform your nurse when you arrive at Short Stay. Do not shave (including legs and underarms) for at  least 48 hours prior to the first CHG shower.  You may shave your face/neck. Please follow these instructions carefully:  1.  Shower with CHG Soap the night before surgery and the  morning of Surgery.  2.  If you choose to wash your hair, wash your hair first as usual with your  normal  shampoo.  3.  After you shampoo, rinse your hair and body thoroughly to remove the  shampoo.                           4.  Use CHG as you would any other liquid soap.  You can apply chg directly  to the skin and wash                       Gently with a scrungie or clean washcloth.  5.  Apply the CHG Soap to your body ONLY FROM THE NECK DOWN.   Do not use on face/ open                           Wound or open sores. Avoid contact with eyes, ears mouth and genitals (private parts).                       Wash face,  Genitals (private parts) with your normal soap.             6.  Wash thoroughly, paying special attention to the area where your surgery  will be performed.  7.  Thoroughly rinse your body with warm water from the neck down.  8.  DO NOT shower/wash with your normal soap after using and rinsing off  the CHG Soap.                9.  Pat yourself dry with a clean towel.            10.  Wear clean pajamas.            11.  Place clean sheets on your bed the night of your first shower and do not  sleep with pets. Day of Surgery : Do not apply any lotions/deodorants the morning of surgery.  Please wear clean clothes to the hospital/surgery center.  FAILURE TO FOLLOW THESE INSTRUCTIONS MAY RESULT IN THE CANCELLATION OF YOUR SURGERY PATIENT SIGNATURE_________________________________  NURSE SIGNATURE__________________________________  ________________________________________________________________________  How to Manage Your Diabetes Before and After Surgery  Why is it important to control my blood sugar before and after surgery? . Improving blood sugar levels before and after surgery helps healing and can  limit problems. . A way of improving blood sugar control is eating a healthy diet by: o  Eating less sugar and carbohydrates o  Increasing activity/exercise o  Talking with your doctor about reaching your blood sugar goals . High blood sugars (greater than 180 mg/dL) can raise your risk of infections and slow your recovery, so you will need to focus on controlling your diabetes during the weeks before surgery. . Make sure that the doctor who takes care of your diabetes knows about your planned surgery including the date and location.  How do I manage my blood sugar before surgery? . Check your blood sugar at least 4 times a day, starting 2 days before surgery, to make sure that the level is not too high or low. o Check your blood sugar the morning of your surgery when you wake up and every 2 hours until you get to the Short Stay unit. . If your blood sugar is less than 70 mg/dL, you will need to treat for low blood sugar: o Do not take insulin. o Treat a low blood sugar (less than 70 mg/dL) with  cup of clear juice (cranberry or apple), 4 glucose tablets, OR glucose gel. o Recheck blood sugar in 15 minutes after treatment (to make sure it is greater than 70 mg/dL). If your blood sugar is not greater than 70 mg/dL on recheck, call [phone] for further instructions. . Report your blood sugar to the short stay nurse when you get to Short Stay.  . If you are admitted to the hospital after surgery: o Your blood sugar will be checked by the staff and you will probably be given insulin after surgery (instead of oral  diabetes medicines) to make sure you have good blood sugar levels. o The goal for blood sugar control after surgery is 80-180 mg/dL.   WHAT DO I DO ABOUT MY DIABETES MEDICATION?  Marland Kitchen Do not take oral diabetes medicines (pills) the morning of surgery.    Patient Signature:  Date:   Nurse Signature:  Date:   Reviewed and Endorsed by [facility] Patient Education Committee,  August 2015   Incentive Spirometer  An incentive spirometer is a tool that can help keep your lungs clear and active. This tool measures how well you are filling your lungs with each breath. Taking long deep breaths may help reverse or decrease the chance of developing breathing (pulmonary) problems (especially infection) following:  A long period of time when you are unable to move or be active. BEFORE THE PROCEDURE   If the spirometer includes an indicator to show your best effort, your nurse or respiratory therapist will set it to a desired goal.  If possible, sit up straight or lean slightly forward. Try not to slouch.  Hold the incentive spirometer in an upright position. INSTRUCTIONS FOR USE  1. Sit on the edge of your bed if possible, or sit up as far as you can in bed or on a chair. 2. Hold the incentive spirometer in an upright position. 3. Breathe out normally. 4. Place the mouthpiece in your mouth and seal your lips tightly around it. 5. Breathe in slowly and as deeply as possible, raising the piston or the ball toward the top of the column. 6. Hold your breath for 3-5 seconds or for as long as possible. Allow the piston or ball to fall to the bottom of the column. 7. Remove the mouthpiece from your mouth and breathe out normally. 8. Rest for a few seconds and repeat Steps 1 through 7 at least 10 times every 1-2 hours when you are awake. Take your time and take a few normal breaths between deep breaths. 9. The spirometer may include an indicator to show your best effort. Use the indicator as a goal to work toward during each repetition. 10. After each set of 10 deep breaths, practice coughing to be sure your lungs are clear. If you have an incision (the cut made at the time of surgery), support your incision when coughing by placing a pillow or rolled up towels firmly against it. Once you are able to get out of bed, walk around indoors and cough well. You may stop using the  incentive spirometer when instructed by your caregiver.  RISKS AND COMPLICATIONS  Take your time so you do not get dizzy or light-headed.  If you are in pain, you may need to take or ask for pain medication before doing incentive spirometry. It is harder to take a deep breath if you are having pain. AFTER USE  Rest and breathe slowly and easily.  It can be helpful to keep track of a log of your progress. Your caregiver can provide you with a simple table to help with this. If you are using the spirometer at home, follow these instructions: SEEK MEDICAL CARE IF:   You are having difficultly using the spirometer.  You have trouble using the spirometer as often as instructed.  Your pain medication is not giving enough relief while using the spirometer.  You develop fever of 100.5 F (38.1 C) or higher. SEEK IMMEDIATE MEDICAL CARE IF:   You cough up bloody sputum that had not been present before.  You develop fever of 102 F (38.9 C) or greater.  You develop worsening pain at or near the incision site. MAKE SURE YOU:   Understand these instructions.  Will watch your condition.  Will get help right away if you are not doing well or get worse. Document Released: 11/04/2006 Document Revised: 09/16/2011 Document Reviewed: 01/05/2007 [facility] Patient Information 2014 [place], [place].   ________________________________________________________________________

## 2016-08-10 LAB — HEMOGLOBIN A1C
Hgb A1c MFr Bld: 9.3 % — ABNORMAL HIGH (ref 4.8–5.6)
Mean Plasma Glucose: 220 mg/dL

## 2016-08-13 ENCOUNTER — Telehealth: Payer: Self-pay | Admitting: Internal Medicine

## 2016-08-13 NOTE — Telephone Encounter (Signed)
Background - pt needed clearance for knee surgery Seen last year for cardiology eval. He was intermediate risk on his stress test w recommendation to have heart cath. This was not done last year. Mr. Robert Sweeney has been cleared for surgery as far as general/primary care and diabetes management. Will still need cardiac clearance so assume cath would still be required? Informed her patient will probably need to be seen in office to have cath but should be OK to be seen by APP  - will verify w Dr. Rennis GoldenHilty.  Case manager French Anaracy will handle scheduling so will need to be called w recommendations for a visit/cath scheduling.

## 2016-08-13 NOTE — Telephone Encounter (Signed)
French Anaracy from [place] [employer] is calling to find out additional information about the patient's last visit in July 2017. She is looking to find out more information about the recommendations for cardiac cath. Please call at [phone].Thanks.

## 2016-08-13 NOTE — Telephone Encounter (Signed)
He did not have cath d/t financial issues. Stress test was abnormal - I cannot "clear" him until I know more about why this was abnormal. Cath would help to resolve this, if financial issues have been worked out.  Dr. HRexene Edison

## 2016-08-13 NOTE — Telephone Encounter (Signed)
Returned call to case Production designer, theatre/television/filmmanager. She was not available, and I left a voice mail. Advised in my instructions to call back, that patient would need to be seen by a provider (APP or MD) for a pre cath workup. Can schedule when she calls back. OK for scheduler to put patient on with Dr. Rennis GoldenHilty or APP for this visit.

## 2016-08-14 NOTE — Telephone Encounter (Signed)
Follow up  Pts wife voiced she would like for [place] to call her in regards to the upcoming appt.  Please f/u

## 2016-08-14 NOTE — Telephone Encounter (Signed)
Call goes to VM. Left msg to call.

## 2016-08-14 NOTE — Telephone Encounter (Signed)
I also called pt's case manager just to confirm call did not come from her. She voiced she called make arrangement for appt but did not need further assistance. Stated patient may have had questions in advance of appt. She's aware I've called pt's home number and left msg.

## 2016-08-15 ENCOUNTER — Encounter (HOSPITAL_COMMUNITY): Admission: RE | Payer: Self-pay | Source: Ambulatory Visit

## 2016-08-15 ENCOUNTER — Ambulatory Visit (HOSPITAL_COMMUNITY): Admission: RE | Admit: 2016-08-15 | Payer: Worker's Compensation | Source: Ambulatory Visit | Admitting: Specialist

## 2016-08-15 SURGERY — ARTHROSCOPY, KNEE
Anesthesia: General | Laterality: Left

## 2016-08-19 NOTE — Progress Notes (Signed)
Cardiology Office Note    Date:  08/20/2016   ID:  Robert Sweeney, DOB 10-23-1960, MRN [id number]  PCP:  No PCP Per Patient  Cardiologist: Dr. Debara Pickett  Chief Complaint  Patient presents with  . Pre-op Exam    History of Present Illness:    Robert Sweeney is a 56 y.o. male with past medical history of HTN, HLD, Type 2 DM, and abnormal stress test (in 12/2015) who presents to the office today for cardiac clearance in regards to upcoming knee surgery.  Was last seen by Dr. Debara Pickett in 01/2016 for follow-up of his abnormal EKG and stress test showing a reduced EF of 39% with inferobasal wall infarction with peri-infarct ischemia involving the inferior and inferolateral wall. Echo showed a preserved EF of 60-65% with no regional WMA. With his abnormal stress test, abnormal EKG,and multiple cardiac risk factors, a cardiac catheterization was recommended for definitive evaluation. This was never performed secondary to financial limitations.   In talking with the patient today, he represents to the office today in regards to further discussing cardiac clearance as he is wanting to undergo surgery for his left knee due to worsening joint pain. He denies any recent chest discomfort or dyspnea with exertion. Reports going to the gym multiple times per week and lifting weights without difficulty. He does not perform regular aerobic exercise secondary to left knee pain.   Denies any lightheadedness, dizziness, palpitations, orthopnea, PND, or lower extremity edema.  He is anxious to have knee surgery, as he has been out of work for over 2 years.   His HTN, HLD and Type 2 DM are followed by his PCP. Last A1c checked on 08/09/2016 was elevated at 9.3 (previously 7.7 just four months prior).   Past Medical History:  Diagnosis Date  . Abnormal stress test    a. 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  . Diabetes  mellitus without complication ([place])    type 2  . Headache    sometimes  . History of diabetes mellitus, type II    diet controlled-"no medications needed anymore"  . Hypertension    under control through diet and exercise    Past Surgical History:  Procedure Laterality Date  . QUADRICEPS TENDON REPAIR Left 09/22/2014   Procedure: REPAIR LEFT QUADRICEP TENDON with patch graft;  Surgeon: Susa Day, MD;  Location: [facility];  Service: Orthopedics;  Laterality: Left;    Current Medications: Outpatient Medications Prior to Visit  Medication Sig Dispense Refill  . atorvastatin (LIPITOR) 20 MG tablet Take 1 tablet (20 mg total) by mouth daily. 90 tablet 1  . b complex vitamins tablet Take 1 tablet by mouth daily. Reported on 11/07/2015    . BEE POLLEN PO Take 1 capsule by mouth daily. Reported on 11/07/2015    . blood glucose meter kit and supplies KIT Dispense based on patient and insurance preference. Check once a day at differing times 1 each 0  . chlorthalidone (HYGROTON) 25 MG tablet Take 1 tablet (25 mg total) by mouth daily. 30 tablet 5  . Cinnamon 500 MG TABS Take 1 tablet by mouth daily. Reported on 11/07/2015    . Coenzyme Q10 (CO Q 10 PO) Take 1 capsule by mouth daily. Reported on 11/07/2015    . dapagliflozin propanediol (FARXIGA) 10 MG TABS tablet Take 10 mg by mouth daily. 30 tablet 4  . docusate sodium (COLACE) 100 MG  capsule Take 1 capsule (100 mg total) by mouth 2 (two) times daily as needed for mild constipation. 20 capsule 1  . losartan (COZAAR) 100 MG tablet Take 1 tablet (100 mg total) by mouth daily. 30 tablet 3  . metFORMIN (GLUCOPHAGE) 500 MG tablet Take 2 tablets (1,000 mg total) by mouth 2 (two) times daily with a meal. 60 tablet 11  . Multiple Vitamin (MULTIVITAMIN WITH MINERALS) TABS tablet Take 1 tablet by mouth every morning. Reported on 11/07/2015    . Nutritional Supplements (DHEA PO) Take 1 capsule by mouth daily. Reported on 11/07/2015    . oxyCODONE-acetaminophen  (PERCOCET) 7.5-325 MG tablet Take 1 tablet by mouth every 4 (four) hours as needed. (pain)  0  . sitaGLIPtin (JANUVIA) 100 MG tablet Take 1 tablet (100 mg total) by mouth daily. 30 tablet 6  . zinc gluconate 50 MG tablet Take 50 mg by mouth daily. Reported on 11/07/2015    . aspirin EC 325 MG tablet Take 325 mg by mouth daily. Reported on 11/07/2015     No facility-administered medications prior to visit.      Allergies:   Patient has no known allergies.   Social History   Social History  . Marital status: Married    Spouse name: N/A  . Number of children: N/A  . Years of education: N/A   Social History Main Topics  . Smoking status: Former Smoker    Types: Cigarettes    Quit date: 09/28/1981  . Smokeless tobacco: Never Used     Comment: some 40 years ago  . Alcohol use Yes     Comment: rare  . Drug use: No  . Sexual activity: Not Asked   Other Topics Concern  . None   Social History Narrative  . None     Family History:  The patient's family history includes Diabetes in his mother; Hypertension in his mother.   Review of Systems:   Please see the history of present illness.     General:  No chills, fever, night sweats or weight changes.  Cardiovascular:  No chest pain, dyspnea on exertion, edema, orthopnea, palpitations, paroxysmal nocturnal dyspnea. Dermatological: No rash, lesions/masses Respiratory: No cough, dyspnea Urologic: No hematuria, dysuria Abdominal:   No nausea, vomiting, diarrhea, bright red blood per rectum, melena, or hematemesis MSK: Positive for left knee pain.  Neurologic:  No visual changes, wkns, changes in mental status. All other systems reviewed and are otherwise negative except as noted above.   Physical Exam:    VS:  BP (!) 146/82   Pulse 72   Ht _0  (1.956 m)   Wt 263 lb (119.3 kg)   BMI 31.19 kg/m    General: Well developed, well nourished [place] American male appearing in no acute distress. Head: Normocephalic, atraumatic,  sclera non-icteric, no xanthomas, nares are without discharge.  Neck: No carotid bruits. JVD not elevated.  Lungs: Respirations regular and unlabored, without wheezes or rales.  Heart: Regular rate and rhythm. No S3 or S4.  No murmur, no rubs, or gallops appreciated. Abdomen: Soft, non-tender, non-distended with normoactive bowel sounds. No hepatomegaly. No rebound/guarding. No obvious abdominal masses. Msk:  Strength and tone appear normal for age. No joint deformities or effusions. Extremities: No clubbing or cyanosis. No edema.  Distal pedal pulses are 2+ bilaterally. Neuro: Alert and oriented X 3. Moves all extremities spontaneously. No focal deficits noted. Psych:  Responds to questions appropriately with a normal affect. Skin: No rashes or lesions noted  Wt Readings from Last 3 Encounters:  08/20/16 263 lb (119.3 kg)  08/09/16 257 lb (116.6 kg)  04/26/16 265 lb (120.2 kg)     Studies/Labs Reviewed:   EKG:  EKG is not ordered today. Imaging from 08/09/2016 reviewed which shows NSR, HR 65, with TWI in the anterolateral leads in the setting of LVH.   Recent Labs: 01/12/2016: TSH 0.90 04/26/2016: ALT 17 08/09/2016: BUN 27; Creatinine, Ser 1.01; Hemoglobin 14.2; Platelets 222; Potassium 3.3; Sodium 138   Lipid Panel    Component Value Date/Time   CHOL 155 11/07/2015 1129   TRIG 59 11/07/2015 1129   HDL 36 (L) 11/07/2015 1129   CHOLHDL 4.3 11/07/2015 1129   VLDL 12 11/07/2015 1129   LDLCALC 107 11/07/2015 1129    Additional studies/ records that were reviewed today include:   Echocardiogram: 12/29/2015 Study Conclusions  - Left ventricle: The cavity size was mildly dilated. There was   moderate concentric hypertrophy. Systolic function was normal.   The estimated ejection fraction was in the range of 60% to 65%.   Wall motion was normal; there were no regional wall motion   abnormalities. Doppler parameters are consistent with both   elevated ventricular end-diastolic filling  pressure and elevated   left atrial filling pressure. - Aortic valve: There was trivial regurgitation. - Left atrium: The atrium was moderately dilated. - Atrial septum: No defect or patent foramen ovale was identified.  NST: 12/22/2015  The left ventricular ejection fraction is moderately decreased (30-44%).  Nuclear stress EF: 39%.  There was no ST segment deviation noted during stress.  Findings consistent with prior myocardial infarction with peri-infarct ischemia.  This is an intermediate risk study.   LVH:  Inferobasal wall infarction with peri infarct ischemia involving the inferior and inferior lateral wall at mid and basal level EF 39% with diffuse hypokinesis   Assessment:    1. Preoperative cardiovascular examination   2. Abnormal stress test   3. Abnormal EKG   4. Essential hypertension   5. Mixed hyperlipidemia   6. Type 2 diabetes mellitus without complication, without long-term current use of insulin ([place])     Plan:   In order of problems listed above:  1. Preoperative Cardiac Clearance for Left Knee Surgery - seen by Dr. Debara Pickett in 01/2016 for initial cardiac clearance. EKG was abnormal and NST showed a reduced EF of 39% with inferobasal wall infarction with peri-infarct ischemia involving the inferior and inferolateral wall. Echo showed a preserved EF of 60-65% with no regional WMA.  - cardiac catheterization was recommended but not performed at that time secondary to financial restrictions. - in talking with the patient today, he denies any recent exertional chest discomfort or dyspnea with exertion. Based off his abnormal NST and EKG, he will need a cardiac catheterization for definitive evaluation prior to cardiac clearance being given. This was discussed with Dr. Oval Linsey (DOD) and has been recommended by Dr. Debara Pickett in prior documented telephone encounters.  - The patient understands that risks include but are not limited to stroke (1 in 1000), death (1 in  62), kidney failure [usually temporary] (1 in 500), bleeding (1 in 200), allergic reaction [possibly serious] (1 in 200). He is in agreement to proceed. He had a recent CBC and BMET last week with stable Hgb and kidney function. K+ mildly decreased (encouraged to consume K+ rich foods). He is adamant about not having labs drawn again if possible. If able to have cath by 2/16, this would be reasonable,  otherwise he will need repeat blood work. Can have PT/INR drawn prior to the procedure.    2. Abnormal EKG/ Abnormal NST - EKG shows TWI along the anterolateral leads in the setting of LVH, however NST in 12/2015 showed a reduced EF of 39% with inferobasal wall infarction with peri-infarct ischemia involving the inferior and inferolateral wall.  - plan for definitive evaluation with a cardiac catheterization as above.   3. Essential HTN - BP at 146/82 during today's visit.Reports systolic readings in the 840'A at home. - continue Losartan. Recommended continuing checking BP at home and if readings > 140, follow-up with PCP regarding medication titration.  4. HLD - on Lipitor 32m daily.  - followed by PCP.   5. Type 2 DM - A1c at 9.3 when checked on 08/09/2016. - currently on Farxiga and Metformin. Will hold Metformin for 24 hours prior to cath and 48 hours afterwards.    Medication Adjustments/Labs and Tests Ordered: Current medicines are reviewed at length with the patient today.  Concerns regarding medicines are outlined above.  Medication changes, Labs and Tests ordered today are listed in the Patient Instructions below. Patient Instructions  Medication Instructions:  Your physician recommends that you continue on your current medications as directed. Please refer to the Current Medication list given to you today.  Labwork: Your physician recommends that you return for lab work in: in hospital have INR  Testing/Procedures: If you need a refill on your cardiac medications before your  next appointment, please call your pharmacy.  Your [facility] Cardiac catheterization is used to diagnose and/or treat various heart conditions. Doctors may recommend this procedure for a number of different reasons. The most common reason is to evaluate chest pain. Chest pain can be a symptom of coronary artery disease (CAD), and cardiac catheterization can show whether plaque is narrowing or blocking your heart's arteries. This procedure is also used to evaluate the valves, as well as measure the blood flow and oxygen levels in different parts of your heart. For further information please visit [email or web address] Please follow instruction sheet, as given.  Following your catheterization, you will not be allowed to drive for 3 days.  No lifting, pushing, or pulling greater that 10 pounds is allowed for 1 week.  You will be required to have the following tests prior to the procedure:  1. Blood work -complete at hospital  2. HOLD your Metformin the day before your cath.   Follow-Up: PENDING CATH  Any Other Special Instructions Will Be Listed Below (If Applicable).    SArna Medici PA  08/20/2016 4:00 PM    [place]  [id number]: [phone] Fax: [phone] SH[place]bor Isle[place] [address]Phone: [phone]

## 2016-08-20 ENCOUNTER — Encounter: Payer: Self-pay | Admitting: Student

## 2016-08-20 ENCOUNTER — Ambulatory Visit (INDEPENDENT_AMBULATORY_CARE_PROVIDER_SITE_OTHER): Payer: Worker's Compensation | Admitting: Student

## 2016-08-20 VITALS — BP 146/82 | HR 72 | Ht 77.0 in | Wt 263.0 lb

## 2016-08-20 DIAGNOSIS — I10 Essential (primary) hypertension: Secondary | ICD-10-CM

## 2016-08-20 DIAGNOSIS — R9431 Abnormal electrocardiogram [ECG] [EKG]: Secondary | ICD-10-CM

## 2016-08-20 DIAGNOSIS — E782 Mixed hyperlipidemia: Secondary | ICD-10-CM

## 2016-08-20 DIAGNOSIS — R9439 Abnormal result of other cardiovascular function study: Secondary | ICD-10-CM | POA: Diagnosis not present

## 2016-08-20 DIAGNOSIS — E785 Hyperlipidemia, unspecified: Secondary | ICD-10-CM | POA: Insufficient documentation

## 2016-08-20 DIAGNOSIS — E119 Type 2 diabetes mellitus without complications: Secondary | ICD-10-CM

## 2016-08-20 DIAGNOSIS — Z01810 Encounter for preprocedural cardiovascular examination: Secondary | ICD-10-CM | POA: Diagnosis not present

## 2016-08-20 NOTE — Patient Instructions (Signed)
Medication Instructions:  Your physician recommends that you continue on your current medications as directed. Please refer to the Current Medication list given to you today.  Labwork: Your physician recommends that you return for lab work in: in hospital have INR  Testing/Procedures: If you need a refill on your cardiac medications before your next appointment, please call your pharmacy.  Your physician has requested that you have [facility]. Cardiac catheterization is used to diagnose and/or treat various heart conditions. Doctors may recommend this procedure for a number of different reasons. The most common reason is to evaluate chest pain. Chest pain can be a symptom of coronary artery disease (CAD), and cardiac catheterization can show whether plaque is narrowing or blocking your heart's arteries. This procedure is also used to evaluate the valves, as well as measure the blood flow and oxygen levels in different parts of your heart. For further information please visit [email or web address]. Please follow instruction sheet, as given.  Following your catheterization, you will not be allowed to drive for 3 days.  No lifting, pushing, or pulling greater that 10 pounds is allowed for 1 week.  You will be required to have the following tests prior to the procedure:  1. Blood work -complete at hospital  2. HOLD your Metformin the day before your cath.  Follow-Up: PENDING CATH  Any Other Special Instructions Will Be Listed Below (If Applicable).

## 2016-08-22 ENCOUNTER — Telehealth: Payer: Self-pay | Admitting: Internal Medicine

## 2016-08-22 NOTE — Telephone Encounter (Signed)
Pt seen as work-in by [place] this week. He was overdue for a recommended cath [facility] advised on this last year). Will go in for this proc tomorrow.   Spoke to case Production designer, theatre/television/filmmanager. Aware that this patient's surgical clearance will be contingent on outcome of cath findings. Advised to call back next week for this. She voiced understanding and thanks.

## 2016-08-22 NOTE — Telephone Encounter (Signed)
[name]  Case manager > Dr Shelle IronBeane needs surgical clearance for lft knee -appt 08-20-16 -pls call [phone]

## 2016-08-23 ENCOUNTER — Encounter (HOSPITAL_COMMUNITY)
Admission: RE | Disposition: A | Discharge status: Home / Self Care | Payer: Self-pay | Source: Ambulatory Visit | Attending: Internal Medicine

## 2016-08-23 ENCOUNTER — Encounter (HOSPITAL_COMMUNITY): Payer: Self-pay | Admitting: Internal Medicine

## 2016-08-23 ENCOUNTER — Ambulatory Visit (HOSPITAL_COMMUNITY)
Admission: RE | Admit: 2016-08-23 | Discharge: 2016-08-23 | Disposition: A | Discharge status: Home / Self Care | Payer: Worker's Compensation | Source: Ambulatory Visit | Attending: Internal Medicine | Admitting: Internal Medicine

## 2016-08-23 DIAGNOSIS — I422 Other hypertrophic cardiomyopathy: Secondary | ICD-10-CM | POA: Diagnosis not present

## 2016-08-23 DIAGNOSIS — R9439 Abnormal result of other cardiovascular function study: Secondary | ICD-10-CM

## 2016-08-23 DIAGNOSIS — E785 Hyperlipidemia, unspecified: Secondary | ICD-10-CM | POA: Insufficient documentation

## 2016-08-23 DIAGNOSIS — I10 Essential (primary) hypertension: Secondary | ICD-10-CM | POA: Insufficient documentation

## 2016-08-23 DIAGNOSIS — E119 Type 2 diabetes mellitus without complications: Secondary | ICD-10-CM | POA: Diagnosis not present

## 2016-08-23 DIAGNOSIS — I2510 Atherosclerotic heart disease of native coronary artery without angina pectoris: Secondary | ICD-10-CM | POA: Insufficient documentation

## 2016-08-23 DIAGNOSIS — R9431 Abnormal electrocardiogram [ECG] [EKG]: Secondary | ICD-10-CM

## 2016-08-23 DIAGNOSIS — Z01810 Encounter for preprocedural cardiovascular examination: Secondary | ICD-10-CM

## 2016-08-23 HISTORY — PX: LEFT HEART CATH AND CORONARY ANGIOGRAPHY: CATH118249

## 2016-08-23 LAB — PROTIME-INR
INR: 1.03
PROTHROMBIN TIME: 13.5 s (ref 11.4–15.2)

## 2016-08-23 LAB — GLUCOSE, CAPILLARY: Glucose-Capillary: 209 mg/dL — ABNORMAL HIGH (ref 65–99)

## 2016-08-23 LAB — POTASSIUM: Potassium: 3.5 mmol/L (ref 3.5–5.1)

## 2016-08-23 SURGERY — LEFT HEART CATH AND CORONARY ANGIOGRAPHY
Anesthesia: LOCAL

## 2016-08-23 MED ORDER — MIDAZOLAM HCL 2 MG/2ML IJ SOLN
INTRAMUSCULAR | Status: DC | PRN
  Administered 2016-08-23: 2 mg via INTRAVENOUS

## 2016-08-23 MED ORDER — HEPARIN (PORCINE) IN NACL 2-0.9 UNIT/ML-% IJ SOLN
INTRAMUSCULAR | Status: AC
  Filled 2016-08-23: qty 1000

## 2016-08-23 MED ORDER — FENTANYL CITRATE (PF) 100 MCG/2ML IJ SOLN
INTRAMUSCULAR | Status: DC | PRN
  Administered 2016-08-23: 50 ug via INTRAVENOUS

## 2016-08-23 MED ORDER — SODIUM CHLORIDE 0.9 % IV SOLN: 250.0000 mL | INTRAVENOUS | Status: DC | PRN

## 2016-08-23 MED ORDER — SODIUM CHLORIDE 0.9 % WEIGHT BASED INFUSION
3.0000 mL/kg/h | INTRAVENOUS | Status: DC
  Administered 2016-08-23: 3 mL/kg/h via INTRAVENOUS

## 2016-08-23 MED ORDER — SODIUM CHLORIDE 0.9 % WEIGHT BASED INFUSION: 1.0000 mL/kg/h | INTRAVENOUS | Status: DC

## 2016-08-23 MED ORDER — SODIUM CHLORIDE 0.9% FLUSH: 3.0000 mL | Freq: Two times a day (BID) | INTRAVENOUS | Status: DC

## 2016-08-23 MED ORDER — ASPIRIN 81 MG PO CHEW
CHEWABLE_TABLET | ORAL | Status: AC
  Filled 2016-08-23: qty 1

## 2016-08-23 MED ORDER — AMLODIPINE BESYLATE 5 MG PO TABS: 5.0000 mg | ORAL_TABLET | Freq: Every day | ORAL | 5 refills | Status: DC

## 2016-08-23 MED ORDER — SODIUM CHLORIDE 0.9% FLUSH: 3.0000 mL | INTRAVENOUS | Status: DC | PRN

## 2016-08-23 MED ORDER — LIDOCAINE HCL (PF) 1 % IJ SOLN
INTRAMUSCULAR | Status: DC | PRN
  Administered 2016-08-23: 2 mL

## 2016-08-23 MED ORDER — LIDOCAINE HCL (PF) 1 % IJ SOLN
INTRAMUSCULAR | Status: AC
  Filled 2016-08-23: qty 30

## 2016-08-23 MED ORDER — MIDAZOLAM HCL 2 MG/2ML IJ SOLN
INTRAMUSCULAR | Status: AC
Start: 2016-08-23 — End: 2016-08-23
  Filled 2016-08-23: qty 2

## 2016-08-23 MED ORDER — METFORMIN HCL 500 MG PO TABS: 1000.0000 mg | ORAL_TABLET | Freq: Two times a day (BID) | ORAL | 11 refills | Status: DC

## 2016-08-23 MED ORDER — HEPARIN SODIUM (PORCINE) 1000 UNIT/ML IJ SOLN
INTRAMUSCULAR | Status: AC
  Filled 2016-08-23: qty 1

## 2016-08-23 MED ORDER — IOPAMIDOL (ISOVUE-370) INJECTION 76%
INTRAVENOUS | Status: DC | PRN
  Administered 2016-08-23: 80 mL via INTRA_ARTERIAL

## 2016-08-23 MED ORDER — HEPARIN (PORCINE) IN NACL 2-0.9 UNIT/ML-% IJ SOLN
INTRAMUSCULAR | Status: DC | PRN
  Administered 2016-08-23: 1000 mL

## 2016-08-23 MED ORDER — VERAPAMIL HCL 2.5 MG/ML IV SOLN
INTRAVENOUS | Status: AC
  Filled 2016-08-23: qty 2

## 2016-08-23 MED ORDER — FENTANYL CITRATE (PF) 100 MCG/2ML IJ SOLN
INTRAMUSCULAR | Status: AC
  Filled 2016-08-23: qty 2

## 2016-08-23 MED ORDER — HEPARIN SODIUM (PORCINE) 1000 UNIT/ML IJ SOLN
INTRAMUSCULAR | Status: DC | PRN
  Administered 2016-08-23: 5000 [IU] via INTRAVENOUS

## 2016-08-23 MED ORDER — ASPIRIN 81 MG PO CHEW
81.0000 mg | CHEWABLE_TABLET | ORAL | Status: AC
  Administered 2016-08-23: 81 mg via ORAL

## 2016-08-23 MED ORDER — SODIUM CHLORIDE 0.9 % IV SOLN: INTRAVENOUS | Status: AC

## 2016-08-23 SURGICAL SUPPLY — 10 items
CATH IMPULSE 5F ANG/FL3.5 (CATHETERS) ×2 IMPLANT
DEVICE RAD COMP TR BAND LRG (VASCULAR PRODUCTS) ×2 IMPLANT
GLIDESHEATH SLEND SS 6F .021 (SHEATH) ×2 IMPLANT
GUIDEWIRE INQWIRE 1.5J.035X260 (WIRE) ×1 IMPLANT
INQWIRE 1.5J .035X260CM (WIRE) ×2
KIT HEART LEFT (KITS) ×2 IMPLANT
PACK CARDIAC CATHETERIZATION (CUSTOM PROCEDURE TRAY) ×2 IMPLANT
SYR MEDRAD MARK V 150ML (SYRINGE) ×2 IMPLANT
TRANSDUCER W/STOPCOCK (MISCELLANEOUS) ×2 IMPLANT
TUBING CIL FLEX 10 FLL-RA (TUBING) ×2 IMPLANT

## 2016-08-23 NOTE — Interval H&P Note (Signed)
History and Physical Interval Note:  08/23/2016 7:30 AM  Robert Sweeney  has presented today for cardiac catherization, with the diagnosis of abnormal nuclear stress test.  The various methods of treatment have been discussed with the patient and family. After consideration of risks, benefits and other options for treatment, the patient has consented to  Procedure(s): Left Heart Cath and Coronary Angiography (N/A) as a surgical intervention .  The patient's history has been reviewed, patient examined, no change in status, stable for surgery.  I have reviewed the patient's chart and labs.  Questions were answered to the patient's satisfaction.    Cath Lab Visit (complete for each Cath Lab visit)  Clinical Evaluation Leading to the Procedure:   ACS: No.  Non-ACS:    Anginal Classification: CCS I  Anti-ischemic medical therapy: No Therapy  Non-Invasive Test Results: Intermediate-risk stress test findings: cardiac mortality 1-3%/year  Prior CABG: No previous CABG   Melitta Tigue

## 2016-08-23 NOTE — Brief Op Note (Signed)
Brief Cardiac Catheterization Note (Full Report to Follow)  Date: 08/23/2016 Time: 9:07 AM  PATIENT:  Robert Sweeney  56 y.o. male  PRE-OPERATIVE DIAGNOSIS:  abnormal nuclear stress test  POST-OPERATIVE DIAGNOSIS:  Non-obstructive coronary artery disease, diastolic dysfunction  PROCEDURE:  Procedure(s): Left Heart Cath and Coronary Angiography (N/A)  SURGEON:  Surgeon(s) and Role:    * Yvonne Kendallhristopher Chason Mciver, MD - Primary  FINDINGS: 1. Mild, non-obstructive CAD. Stress test findings false positive. 2. Mild to moderately elevated left ventricular filling pressure (LVEDP 30 mmHg post atrial contraction). 3. Hyperdynamic LV contraction.  RECOMMENDATIONS: 1. Add amlodipine 5 mg daily for improved blood pressure/afterload control. 2. Continue primary prevention.  Yvonne Kendallhristopher Khalfani Weideman, MD [facility] Pager: [phone]

## 2016-08-23 NOTE — H&P (View-Only) (Signed)
Cardiology Office Note    Date:  08/20/2016   ID:  Robert Sweeney, DOB 10-23-1960, MRN [id number]  PCP:  No PCP Per Patient  Cardiologist: Dr. Debara Pickett  Chief Complaint  Patient presents with  . Pre-op Exam    History of Present Illness:    Robert Sweeney is a 56 y.o. male with past medical history of HTN, HLD, Type 2 DM, and abnormal stress test (in 12/2015) who presents to the office today for cardiac clearance in regards to upcoming knee surgery.  Was last seen by Dr. Debara Pickett in 01/2016 for follow-up of his abnormal EKG and stress test showing a reduced EF of 39% with inferobasal wall infarction with peri-infarct ischemia involving the inferior and inferolateral wall. Echo showed a preserved EF of 60-65% with no regional WMA. With his abnormal stress test, abnormal EKG,and multiple cardiac risk factors, a cardiac catheterization was recommended for definitive evaluation. This was never performed secondary to financial limitations.   In talking with the patient today, he represents to the office today in regards to further discussing cardiac clearance as he is wanting to undergo surgery for his left knee due to worsening joint pain. He denies any recent chest discomfort or dyspnea with exertion. Reports going to the gym multiple times per week and lifting weights without difficulty. He does not perform regular aerobic exercise secondary to left knee pain.   Denies any lightheadedness, dizziness, palpitations, orthopnea, PND, or lower extremity edema.  He is anxious to have knee surgery, as he has been out of work for over 2 years.   His HTN, HLD and Type 2 DM are followed by his PCP. Last A1c checked on 08/09/2016 was elevated at 9.3 (previously 7.7 just four months prior).   Past Medical History:  Diagnosis Date  . Abnormal stress test    a. 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  . Diabetes  mellitus without complication ([place])    type 2  . Headache    sometimes  . History of diabetes mellitus, type II    diet controlled-"no medications needed anymore"  . Hypertension    under control through diet and exercise    Past Surgical History:  Procedure Laterality Date  . QUADRICEPS TENDON REPAIR Left 09/22/2014   Procedure: REPAIR LEFT QUADRICEP TENDON with patch graft;  Surgeon: Susa Day, MD;  Location: [facility];  Service: Orthopedics;  Laterality: Left;    Current Medications: Outpatient Medications Prior to Visit  Medication Sig Dispense Refill  . atorvastatin (LIPITOR) 20 MG tablet Take 1 tablet (20 mg total) by mouth daily. 90 tablet 1  . b complex vitamins tablet Take 1 tablet by mouth daily. Reported on 11/07/2015    . BEE POLLEN PO Take 1 capsule by mouth daily. Reported on 11/07/2015    . blood glucose meter kit and supplies KIT Dispense based on patient and insurance preference. Check once a day at differing times 1 each 0  . chlorthalidone (HYGROTON) 25 MG tablet Take 1 tablet (25 mg total) by mouth daily. 30 tablet 5  . Cinnamon 500 MG TABS Take 1 tablet by mouth daily. Reported on 11/07/2015    . Coenzyme Q10 (CO Q 10 PO) Take 1 capsule by mouth daily. Reported on 11/07/2015    . dapagliflozin propanediol (FARXIGA) 10 MG TABS tablet Take 10 mg by mouth daily. 30 tablet 4  . docusate sodium (COLACE) 100 MG  capsule Take 1 capsule (100 mg total) by mouth 2 (two) times daily as needed for mild constipation. 20 capsule 1  . losartan (COZAAR) 100 MG tablet Take 1 tablet (100 mg total) by mouth daily. 30 tablet 3  . metFORMIN (GLUCOPHAGE) 500 MG tablet Take 2 tablets (1,000 mg total) by mouth 2 (two) times daily with a meal. 60 tablet 11  . Multiple Vitamin (MULTIVITAMIN WITH MINERALS) TABS tablet Take 1 tablet by mouth every morning. Reported on 11/07/2015    . Nutritional Supplements (DHEA PO) Take 1 capsule by mouth daily. Reported on 11/07/2015    . oxyCODONE-acetaminophen  (PERCOCET) 7.5-325 MG tablet Take 1 tablet by mouth every 4 (four) hours as needed. (pain)  0  . sitaGLIPtin (JANUVIA) 100 MG tablet Take 1 tablet (100 mg total) by mouth daily. 30 tablet 6  . zinc gluconate 50 MG tablet Take 50 mg by mouth daily. Reported on 11/07/2015    . aspirin EC 325 MG tablet Take 325 mg by mouth daily. Reported on 11/07/2015     No facility-administered medications prior to visit.      Allergies:   Patient has no known allergies.   Social History   Social History  . Marital status: Married    Spouse name: N/A  . Number of children: N/A  . Years of education: N/A   Social History Main Topics  . Smoking status: Former Smoker    Types: Cigarettes    Quit date: 09/28/1981  . Smokeless tobacco: Never Used     Comment: some 40 years ago  . Alcohol use Yes     Comment: rare  . Drug use: No  . Sexual activity: Not Asked   Other Topics Concern  . None   Social History Narrative  . None     Family History:  The patient's family history includes Diabetes in his mother; Hypertension in his mother.   Review of Systems:   Please see the history of present illness.     General:  No chills, fever, night sweats or weight changes.  Cardiovascular:  No chest pain, dyspnea on exertion, edema, orthopnea, palpitations, paroxysmal nocturnal dyspnea. Dermatological: No rash, lesions/masses Respiratory: No cough, dyspnea Urologic: No hematuria, dysuria Abdominal:   No nausea, vomiting, diarrhea, bright red blood per rectum, melena, or hematemesis MSK: Positive for left knee pain.  Neurologic:  No visual changes, wkns, changes in mental status. All other systems reviewed and are otherwise negative except as noted above.   Physical Exam:    VS:  BP (!) 146/82   Pulse 72   Ht _0  (1.956 m)   Wt 263 lb (119.3 kg)   BMI 31.19 kg/m    General: Well developed, well nourished [place] American male appearing in no acute distress. Head: Normocephalic, atraumatic,  sclera non-icteric, no xanthomas, nares are without discharge.  Neck: No carotid bruits. JVD not elevated.  Lungs: Respirations regular and unlabored, without wheezes or rales.  Heart: Regular rate and rhythm. No S3 or S4.  No murmur, no rubs, or gallops appreciated. Abdomen: Soft, non-tender, non-distended with normoactive bowel sounds. No hepatomegaly. No rebound/guarding. No obvious abdominal masses. Msk:  Strength and tone appear normal for age. No joint deformities or effusions. Extremities: No clubbing or cyanosis. No edema.  Distal pedal pulses are 2+ bilaterally. Neuro: Alert and oriented X 3. Moves all extremities spontaneously. No focal deficits noted. Psych:  Responds to questions appropriately with a normal affect. Skin: No rashes or lesions noted  Wt Readings from Last 3 Encounters:  08/20/16 263 lb (119.3 kg)  08/09/16 257 lb (116.6 kg)  04/26/16 265 lb (120.2 kg)     Studies/Labs Reviewed:   EKG:  EKG is not ordered today. Imaging from 08/09/2016 reviewed which shows NSR, HR 65, with TWI in the anterolateral leads in the setting of LVH.   Recent Labs: 01/12/2016: TSH 0.90 04/26/2016: ALT 17 08/09/2016: BUN 27; Creatinine, Ser 1.01; Hemoglobin 14.2; Platelets 222; Potassium 3.3; Sodium 138   Lipid Panel    Component Value Date/Time   CHOL 155 11/07/2015 1129   TRIG 59 11/07/2015 1129   HDL 36 (L) 11/07/2015 1129   CHOLHDL 4.3 11/07/2015 1129   VLDL 12 11/07/2015 1129   LDLCALC 107 11/07/2015 1129    Additional studies/ records that were reviewed today include:   Echocardiogram: 12/29/2015 Study Conclusions  - Left ventricle: The cavity size was mildly dilated. There was   moderate concentric hypertrophy. Systolic function was normal.   The estimated ejection fraction was in the range of 60% to 65%.   Wall motion was normal; there were no regional wall motion   abnormalities. Doppler parameters are consistent with both   elevated ventricular end-diastolic filling  pressure and elevated   left atrial filling pressure. - Aortic valve: There was trivial regurgitation. - Left atrium: The atrium was moderately dilated. - Atrial septum: No defect or patent foramen ovale was identified.  NST: 12/22/2015  The left ventricular ejection fraction is moderately decreased (30-44%).  Nuclear stress EF: 39%.  There was no ST segment deviation noted during stress.  Findings consistent with prior myocardial infarction with peri-infarct ischemia.  This is an intermediate risk study.   LVH:  Inferobasal wall infarction with peri infarct ischemia involving the inferior and inferior lateral wall at mid and basal level EF 39% with diffuse hypokinesis   Assessment:    1. Preoperative cardiovascular examination   2. Abnormal stress test   3. Abnormal EKG   4. Essential hypertension   5. Mixed hyperlipidemia   6. Type 2 diabetes mellitus without complication, without long-term current use of insulin ([place])     Plan:   In order of problems listed above:  1. Preoperative Cardiac Clearance for Left Knee Surgery - seen by Dr. Debara Pickett in 01/2016 for initial cardiac clearance. EKG was abnormal and NST showed a reduced EF of 39% with inferobasal wall infarction with peri-infarct ischemia involving the inferior and inferolateral wall. Echo showed a preserved EF of 60-65% with no regional WMA.  - cardiac catheterization was recommended but not performed at that time secondary to financial restrictions. - in talking with the patient today, he denies any recent exertional chest discomfort or dyspnea with exertion. Based off his abnormal NST and EKG, he will need a cardiac catheterization for definitive evaluation prior to cardiac clearance being given. This was discussed with Dr. Oval Linsey (DOD) and has been recommended by Dr. Debara Pickett in prior documented telephone encounters.  - The patient understands that risks include but are not limited to stroke (1 in 1000), death (1 in  62), kidney failure [usually temporary] (1 in 500), bleeding (1 in 200), allergic reaction [possibly serious] (1 in 200). He is in agreement to proceed. He had a recent CBC and BMET last week with stable Hgb and kidney function. K+ mildly decreased (encouraged to consume K+ rich foods). He is adamant about not having labs drawn again if possible. If able to have cath by 2/16, this would be reasonable,  otherwise he will need repeat blood work. Can have PT/INR drawn prior to the procedure.    2. Abnormal EKG/ Abnormal NST - EKG shows TWI along the anterolateral leads in the setting of LVH, however NST in 12/2015 showed a reduced EF of 39% with inferobasal wall infarction with peri-infarct ischemia involving the inferior and inferolateral wall.  - plan for definitive evaluation with a cardiac catheterization as above.   3. Essential HTN - BP at 146/82 during today's visit.Reports systolic readings in the 840'A at home. - continue Losartan. Recommended continuing checking BP at home and if readings > 140, follow-up with PCP regarding medication titration.  4. HLD - on Lipitor 32m daily.  - followed by PCP.   5. Type 2 DM - A1c at 9.3 when checked on 08/09/2016. - currently on Farxiga and Metformin. Will hold Metformin for 24 hours prior to cath and 48 hours afterwards.    Medication Adjustments/Labs and Tests Ordered: Current medicines are reviewed at length with the patient today.  Concerns regarding medicines are outlined above.  Medication changes, Labs and Tests ordered today are listed in the Patient Instructions below. Patient Instructions  Medication Instructions:  Your physician recommends that you continue on your current medications as directed. Please refer to the Current Medication list given to you today.  Labwork: Your physician recommends that you return for lab work in: in hospital have INR  Testing/Procedures: If you need a refill on your cardiac medications before your  next appointment, please call your pharmacy.  Your physician [facility] Cardiac catheterization is used to diagnose and/or treat various heart conditions. Doctors may recommend this procedure for a number of different reasons. The most common reason is to evaluate chest pain. Chest pain can be a symptom of coronary artery disease (CAD), and cardiac catheterization can show whether plaque is narrowing or blocking your heart's arteries. This procedure is also used to evaluate the valves, as well as measure the blood flow and oxygen levels in different parts of your heart. For further information please visit [email or web address] Please follow instruction sheet, as given.  Following your catheterization, you will not be allowed to drive for 3 days.  No lifting, pushing, or pulling greater that 10 pounds is allowed for 1 week.  You will be required to have the following tests prior to the procedure:  1. Blood work -complete at hospital  2. HOLD your Metformin the day before your cath.   Follow-Up: PENDING CATH  Any Other Special Instructions Will Be Listed Below (If Applicable).    SArna Medici PA  08/20/2016 4:00 PM    [place]  [id number]: [phone] Fax: [phone] SH[address]bor Isle[place]ot[place] [address]Phone: [phone]

## 2016-08-23 NOTE — Discharge Instructions (Signed)
Hold Metformin until Monday   Radial Site Care Introduction Refer to this sheet in the next few weeks. These instructions provide you with information about caring for yourself after your procedure. Your health care provider may also give you more specific instructions. Your treatment has been planned according to current medical practices, but problems sometimes occur. Call your health care provider if you have any problems or questions after your procedure. What can I expect after the procedure? After your procedure, it is typical to have the following:  Bruising at the radial site that usually fades within 1-2 weeks.  Blood collecting in the tissue (hematoma) that may be painful to the touch. It should usually decrease in size and tenderness within 1-2 weeks. Follow these instructions at home:  Take medicines only as directed by your health care provider.  You may shower 24-48 hours after the procedure or as directed by your health care provider. Remove the bandage (dressing) and gently wash the site with plain soap and water. Pat the area dry with a clean towel. Do not rub the site, because this may cause bleeding.  Do not take baths, swim, or use a hot tub until your health care provider approves.  Check your insertion site every day for redness, swelling, or drainage.  Do not apply powder or lotion to the site.  Do not flex or bend the affected arm for 24 hours or as directed by your health care provider.  Do not push or pull heavy objects with the affected arm for 24 hours or as directed by your health care provider.  Do not lift over 10 lb (4.5 kg) for 5 days after your procedure or as directed by your health care provider.  Ask your health care provider when it is okay to:  Return to work or school.  Resume usual physical activities or sports.  Resume sexual activity.  Do not drive home if you are discharged the same day as the procedure. Have someone else drive  you.  You may drive 24 hours after the procedure unless otherwise instructed by your health care provider.  Do not operate machinery or power tools for 24 hours after the procedure.  If your procedure was done as an outpatient procedure, which means that you went home the same day as your procedure, a responsible adult should be with you for the first 24 hours after you arrive home.  Keep all follow-up visits as directed by your health care provider. This is important. Contact a health care provider if:  You have a fever.  You have chills.  You have increased bleeding from the radial site. Hold pressure on the site. Get help right away if:  You have unusual pain at the radial site.  You have redness, warmth, or swelling at the radial site.  You have drainage (other than a small amount of blood on the dressing) from the radial site.  The radial site is bleeding, and the bleeding does not stop after 30 minutes of holding steady pressure on the site.  Your arm or hand becomes pale, cool, tingly, or numb. This information is not intended to replace advice given to you by your health care provider. Make sure you discuss any questions you have with your health care provider. Document Released: 07/27/2010 Document Revised: 11/30/2015 Document Reviewed: 01/10/2014  2017 Elsevier

## 2016-08-25 NOTE — Progress Notes (Signed)
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  Dr. HRexene Edison

## 2016-08-26 ENCOUNTER — Telehealth: Payer: Self-pay | Admitting: Internal Medicine

## 2016-08-26 MED FILL — Heparin Sodium (Porcine) Inj 1000 Unit/ML: INTRAMUSCULAR | Qty: 10 | Status: AC

## 2016-08-26 MED FILL — Verapamil HCl IV Soln 2.5 MG/ML: INTRAVENOUS | Qty: 2 | Status: AC

## 2016-08-26 MED FILL — Heparin Sodium (Porcine) 2 Unit/ML in Sodium Chloride 0.9%: INTRAMUSCULAR | Qty: 500 | Status: AC

## 2016-08-26 NOTE — Telephone Encounter (Signed)
I spoke with [place] - he is clear for surgery. I would like to see him back in follow-up after surgery to discuss management of hypertrophic cardiomyopathy, which is his diagnosis.  Dr. HRexene Edison

## 2016-08-26 NOTE — Telephone Encounter (Signed)
RE: cath follow up/clearance  Received: Today  Message Contents  Chrystie Nose, MD  Lindell Spar, RN        We can clear him based on his cath results. I can follow-up after his surgery.   Dr. Rexene Edison

## 2016-08-26 NOTE — Telephone Encounter (Signed)
Follow up   New message    [place] Anaracy verbalized that she is calling for   rn to ask more questions about cath

## 2016-08-26 NOTE — Telephone Encounter (Signed)
Follow up  [name] is calling in regards to appt and would like for someone to follow up with her.

## 2016-08-26 NOTE — Telephone Encounter (Signed)
Spoke with tracey, cath recommendations discussed and cath report faxed to her @ [phone]. She is going to discuss with the patient the need for a follow up appointment. Will forward to dr hilty to see if the patient can be cleared for surgery based on cath results or if he will need to be seen first.

## 2016-08-26 NOTE — Telephone Encounter (Signed)
New Message °

## 2016-08-26 NOTE — Telephone Encounter (Signed)
Left msg to call.

## 2016-08-27 NOTE — Telephone Encounter (Signed)
Faxed clearance notes to case manager at her request. Reviewed this - unsure based on documentation if surgical center received clearance notification yet - routed to [place]enna to f/u.

## 2016-08-27 NOTE — Telephone Encounter (Signed)
LMTCB to find out who clearance needs to be sent to

## 2016-08-27 NOTE — Telephone Encounter (Signed)
This encounter will be closed as info has already been sent to patient's CM. Clearance to be addressed once return call by patient is received w/who we need to send clearance to

## 2016-08-30 NOTE — Telephone Encounter (Signed)
Reviewed chart further as no patient call back and located telephone note from 08/22/16: Robert Sweeney    [] Hide copied text [name]  Case manager > Dr Shelle IronBeane needs surgical clearance for lft knee -appt 08-20-16 -pls call [phone]     Clearance sent to Dr. Ermelinda DasBeane's office [facility] LM for patient that clearance was sent

## 2016-09-06 NOTE — Progress Notes (Signed)
NEED ORDERS IN EPIC FOR 3-22 SURGERY

## 2016-09-10 ENCOUNTER — Other Ambulatory Visit: Payer: Self-pay | Admitting: *Deleted

## 2016-09-10 DIAGNOSIS — E119 Type 2 diabetes mellitus without complications: Secondary | ICD-10-CM

## 2016-09-10 MED ORDER — SITAGLIPTIN PHOSPHATE 100 MG PO TABS: 100.0000 mg | ORAL_TABLET | Freq: Every day | ORAL | 6 refills | Status: AC

## 2016-09-17 ENCOUNTER — Ambulatory Visit: Payer: Self-pay | Admitting: Orthopedic Surgery

## 2016-09-17 NOTE — Progress Notes (Signed)
Surgery on 3/22.  Preop on 3/16.  Need orders in epic.  Thank You

## 2016-09-17 NOTE — H&P (Signed)
Robert BranchDavid [name] is an 56 y.o. male.   Chief Complaint: Left knee pain HPI: The patient is a 56 year old male who presents today for follow up of their knee. The patient is being followed for their left quadriceps tendon repair. They are now nearly 2 years out from surgery (DOS 09/22/14). The patient is 2 year, 3 months out from the injury (DOI 06/21/2014). Symptoms reported today include: pain. Current treatment includes: activity modification and pain medications. The following medication has been used for pain control: Percocet. The patient presents today following MRI. Note for "Follow-up Knee": The patient is out of work. NCM Robert Sweeney.  Robert Sweeney follows up with Robert Sweeney, his case manager. He has had his MRI repeated and it does show a probable meniscus tear medially. 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 Quad tendon shows focus of increased signal in the distal tendon consistent with a low-grade partial tear.  Tendinosis of the proximal patellar tendon is noted as well with a chronic Osgood-Schlatterus ossicle. He reports continued pain in the quadriceps, locking, giving way. He is taking one to two Percocet a day. He has not taken it from any other individual. He is not somnolent from that. He is not combining that with other medications. He indicates without that he is unable to sleep.  He also indicates his A1c has been normalized. He has clearance from his medical physician. Apparently he had two stress tests, one questionable and another one fine. We do now have official cardiac clearance.      Past Medical History:  Diagnosis Date  . Diabetes mellitus without complication (HCC)    type 2  . Headache    sometimes  . History of diabetes mellitus, type II    diet controlled-"no medications needed anymore"  . Hypertension    under control through diet and exercise         Past Surgical History:  Procedure Laterality Date  . NO PAST  SURGERIES    . QUADRICEPS TENDON REPAIR Left 09/22/2014   Procedure: REPAIR LEFT QUADRICEP TENDON with patch graft;  Surgeon: [name], MD;  Location: [facility];  Service: Orthopedics;  Laterality: Left;         Family History  Problem Relation Age of Onset  . Diabetes Mother   . Hypertension Mother    Social History:  reports that he quit smoking about 34 years ago. His smoking use included Cigarettes. He has never used smokeless tobacco. He reports that he drinks alcohol. He reports that he does not use drugs.  Allergies: No Known Allergies   (Not in a hospital admission)  LabResultsLast48Hours  No results found for this or any previous visit (from the past 48 hour(s)).   ImagingResults(Last48hours)  No results found.    Review of Systems  Constitutional: Negative.   HENT: Negative.   Eyes: Negative.   Respiratory: Negative.   Cardiovascular: Negative.   Gastrointestinal: Negative.   Genitourinary: Negative.   Musculoskeletal: Positive for joint pain.  Skin: Negative.   Neurological: Negative.   Psychiatric/Behavioral: Negative.     There were no vitals taken for this visit. Physical Exam  Constitutional: He is oriented to person, place, and time. He appears well-developed.  HENT:  Head: Normocephalic.  Eyes: Pupils are equal, round, and reactive to light.  Neck: Normal range of motion.  Cardiovascular: Normal rate.   Respiratory: Effort normal.  GI: Soft.  Musculoskeletal:  On exam, he is tender along the distal quadriceps  tendon, fairly severely. He is able to perform an active straight leg raise, however. He is tender along the medial joint line. Equivocal McMmurray. Trace effusion.  Knee exam on inspection reveals no evidence of soft tissue swelling, ecchymosis, deformity or erythema. On palpation there is no tenderness in the medial and lateral joint line. No patellofemoral pain with compression. Nontender over the fibular head or the  peroneal nerve. The range of motion was full. Provocative maneuvers revealed a negative Lachman, negative anterior and posterior drawer. No instability was noted with varus and valgus stressing at 0 or 30 degrees. On manual motor test the quadriceps and hamstrings were 5/5. Sensory exam was intact to light touch.  Neurological: He is alert and oriented to person, place, and time.  Skin: Skin is warm and dry.     Assessment/Plan 1. 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 2. Probable symptomatic medial meniscus tear. 3. Diabetes, reportedly well under control. 4. Questionable cardiac history.  At this point in time, we discussed the options again proceeding with surgical intervention. The MRI update shows no significant changes from the previous, so our previous recommendation stand.  We discussed the surgery again in detail, the patch graft, postoperative recovery, arthroscopy prior to that to evaluate for meniscal pathology and the patellofemoral arthrosis and the importance to optimize his blood glucose postoperatively as that is an indicator of healing. Previously he had suboptimal blood glucose control and I believe that may have contributed to his re-tear.  I would recommend reconciliation of his cardiac condition. He still has not seen a cardiologist. Apparently, there is a barrier to that. One other option maybe to have a preoperative consultation with anesthesia to further discuss his current symptoms, condition and cardiac risk to determine whether any additional studies need to be performed and then we will proceed accordingly. Continue his out of work. I discussed this in front of the patient with Robert Decent his case manager indicating the pathology, relevant anatomy and treatment.  We spent up to four minutes discussing all of these issues separately and in front of the patient with Robert Decent, case manager. Hopefully, we can proceed in a timely fashion. We discussed  postoperative course, three months to maximum medical improvement engaging in physical therapy. I refilled his Percocet, again to be used sparingly, mainly at night. Again assured me he is not receiving it from another source. He is not somnolent secondary to that. This is well tolerated.  Plan Left knee arthroscopy, partial medial meniscectomy, debridement, revision quad tendon repair with patch graft  Elbony Mcclimans, Dayna Barker., PA-C for Dr. Shelle Iron

## 2016-09-19 NOTE — Progress Notes (Signed)
08-09-16 EPIC EKG 08-23-16 EPIC Left [place] Cath 12-29-15 EPIC Echo 12-12-15 EPIC Stress Test- Abnormal Stress needs Cath 08-09-16 EPIC HGA1C 9.3, CBC, CMP 08-23-16 EPIC PT/INR, Potassium

## 2016-09-19 NOTE — Patient Instructions (Addendum)
Robert Sweeney  09/19/2016   Your procedure is scheduled on: Thursday 09/26/16  Report to [facility]  Entrance take [place]  elevators to 3rd floor to  Short Stay Center at 0530 AM.  Call this number if you have problems the morning of surgery [phone]   Remember: ONLY 1 PERSON MAY GO WITH YOU TO SHORT STAY TO GET  READY MORNING OF YOUR SURGERY.  Do not eat food or drink liquids :After Midnight.     Take these medicines the morning of surgery with A SIP OF WATER: Amlodipine (Norvasc),  oxycodone if needed                               You may not have any metal on your body including hair pins and              piercings  Do not wear jewelry, make-up, lotions, powders or perfumes, deodorant             Do not wear nail polish.  Do not shave  48 hours prior to surgery.              Men may shave face and neck.   Do not bring valuables to the hospital. [facility] IS NOT             RESPONSIBLE   FOR VALUABLES.  Contacts, dentures or bridgework may not be worn into surgery.  Leave suitcase in the car. After surgery it may be brought to your room.     Please read over the following fact sheets you were given: _____________________________________________________________________  [facility] - Preparing for Surgery Before surgery, you can play an important role.  Because skin is not sterile, your skin needs to be as free of germs as possible.  You can reduce the number of germs on your skin by washing with CHG (chlorahexidine gluconate) soap before surgery.  CHG is an antiseptic cleaner which kills germs and bonds with the skin to continue killing germs even after washing. Please DO NOT use if you have an allergy to CHG or antibacterial soaps.  If your skin becomes reddened/irritated stop using the CHG and inform your nurse when you arrive at Short Stay. Do not shave (including legs and underarms) for at least 48 hours prior to the first CHG shower.  You may  shave your face/neck. Please follow these instructions carefully:  1.  Shower with CHG Soap the night before surgery and the  morning of Surgery.  2.  If you choose to wash your hair, wash your hair first as usual with your  normal  shampoo.  3.  After you shampoo, rinse your hair and body thoroughly to remove the  shampoo.                           4.  Use CHG as you would any other liquid soap.  You can apply chg directly  to the skin and wash                       Gently with a scrungie or clean washcloth.  5.  Apply the CHG Soap to your body ONLY FROM THE NECK DOWN.   Do not use on face/ open  Wound or open sores. Avoid contact with eyes, ears mouth and genitals (private parts).                       Wash face,  Genitals (private parts) with your normal soap.             6.  Wash thoroughly, paying special attention to the area where your surgery  will be performed.  7.  Thoroughly rinse your body with warm water from the neck down.  8.  DO NOT shower/wash with your normal soap after using and rinsing off  the CHG Soap.                9.  Pat yourself dry with a clean towel.            10.  Wear clean pajamas.            11.  Place clean sheets on your bed the night of your first shower and do not  sleep with pets. Day of Surgery : Do not apply any lotions/deodorants the morning of surgery.  Please wear clean clothes to the hospital/surgery center.  FAILURE TO FOLLOW THESE INSTRUCTIONS MAY RESULT IN THE CANCELLATION OF YOUR SURGERY PATIENT SIGNATURE_________________________________  NURSE SIGNATURE__________________________________  ________________________________________________________________________ How to Manage Your Diabetes Before and After Surgery  Why is it important to control my blood sugar before and after surgery? . Improving blood sugar levels before and after surgery helps healing and can limit problems. . A way of improving blood sugar  control is eating a healthy diet by: o  Eating less sugar and carbohydrates o  Increasing activity/exercise o  Talking with your doctor about reaching your blood sugar goals . High blood sugars (greater than 180 mg/dL) can raise your risk of infections and slow your recovery, so you will need to focus on controlling your diabetes during the weeks before surgery. . Make sure that the doctor who takes care of your diabetes knows about your planned surgery including the date and location.  How do I manage my blood sugar before surgery? . Check your blood sugar at least 4 times a day, starting 2 days before surgery, to make sure that the level is not too high or low. o Check your blood sugar the morning of your surgery when you wake up and every 2 hours until you get to the Short Stay unit. . If your blood sugar is less than 70 mg/dL, you will need to treat for low blood sugar: o Do not take insulin. o Treat a low blood sugar (less than 70 mg/dL) with  cup of clear juice (cranberry or apple), 4 glucose tablets, OR glucose gel. o Recheck blood sugar in 15 minutes after treatment (to make sure it is greater than 70 mg/dL). If your blood sugar is not greater than 70 mg/dL on recheck, call [phone] for further instructions. . Report your blood sugar to the short stay nurse when you get to Short Stay.  . If you are admitted to the hospital after surgery: o Your blood sugar will be checked by the staff and you will probably be given insulin after surgery (instead of oral diabetes medicines) to make sure you have good blood sugar levels. o The goal for blood sugar control after surgery is 80-180 mg/dL.   THE DAY BEFORE SURGERY, TAKE FARXIGA AS USUAL THE DAY BEFORE SURGERY, TAKE MORNING DOSE OF METFORMIN   THE DAY BEFORE SURGERY, TAKE  JANUVIA AS USUAL  DO NOT TAKE ANY ORAL DIABETIC MEDICATION ON THE MORNING OF SURGERY        Incentive Spirometer  An incentive spirometer is a tool that can  help keep your lungs clear and active. This tool measures how well you are filling your lungs with each breath. Taking long deep breaths may help reverse or decrease the chance of developing breathing (pulmonary) problems (especially infection) following:  A long period of time when you are unable to move or be active. BEFORE THE PROCEDURE   If the spirometer includes an indicator to show your best effort, your nurse or respiratory therapist will set it to a desired goal.  If possible, sit up straight or lean slightly forward. Try not to slouch.  Hold the incentive spirometer in an upright position. INSTRUCTIONS FOR USE  1. Sit on the edge of your bed if possible, or sit up as far as you can in bed or on a chair. 2. Hold the incentive spirometer in an upright position. 3. Breathe out normally. 4. Place the mouthpiece in your mouth and seal your lips tightly around it. 5. Breathe in slowly and as deeply as possible, raising the piston or the ball toward the top of the column. 6. Hold your breath for 3-5 seconds or for as long as possible. Allow the piston or ball to fall to the bottom of the column. 7. Remove the mouthpiece from your mouth and breathe out normally. 8. Rest for a few seconds and repeat Steps 1 through 7 at least 10 times every 1-2 hours when you are awake. Take your time and take a few normal breaths between deep breaths. 9. The spirometer may include an indicator to show your best effort. Use the indicator as a goal to work toward during each repetition. 10. After each set of 10 deep breaths, practice coughing to be sure your lungs are clear. If you have an incision (the cut made at the time of surgery), support your incision when coughing by placing a pillow or rolled up towels firmly against it. Once you are able to get out of bed, walk around indoors and cough well. You may stop using the incentive spirometer when instructed by your caregiver.  RISKS AND COMPLICATIONS  Take  your time so you do not get dizzy or light-headed.  If you are in pain, you may need to take or ask for pain medication before doing incentive spirometry. It is harder to take a deep breath if you are having pain. AFTER USE  Rest and breathe slowly and easily.  It can be helpful to keep track of a log of your progress. Your caregiver can provide you with a simple table to help with this. If you are using the spirometer at home, follow these instructions: SEEK MEDICAL CARE IF:   You are having difficultly using the spirometer.  You have trouble using the spirometer as often as instructed.  Your pain medication is not giving enough relief while using the spirometer.  You develop fever of 100.5 F (38.1 C) or higher. SEEK IMMEDIATE MEDICAL CARE IF:   You cough up bloody sputum that had not been present before.  You develop fever of 102 F (38.9 C) or greater.  You develop worsening pain at or near the incision site. MAKE SURE YOU:   Understand these instructions.  Will watch your condition.  Will get help right away if you are not doing well or get worse. Document Released:  11/04/2006 Document Revised: 09/16/2011 Document Reviewed: 01/05/2007 Exit[employer] Patient Information 2014 [place], [place].   ________________________________________________________________________

## 2016-09-20 ENCOUNTER — Encounter (HOSPITAL_COMMUNITY)
Admission: RE | Admit: 2016-09-20 | Discharge: 2016-09-20 | Disposition: A | Discharge status: Home / Self Care | Payer: Worker's Compensation | Source: Ambulatory Visit | Attending: Specialist | Admitting: Specialist

## 2016-09-20 ENCOUNTER — Encounter (HOSPITAL_COMMUNITY): Payer: Self-pay

## 2016-09-20 ENCOUNTER — Ambulatory Visit: Payer: Self-pay | Admitting: Orthopedic Surgery

## 2016-09-20 DIAGNOSIS — Z01812 Encounter for preprocedural laboratory examination: Secondary | ICD-10-CM | POA: Diagnosis present

## 2016-09-20 LAB — CBC
HCT: 45 % (ref 39.0–52.0)
Hemoglobin: 15.1 g/dL (ref 13.0–17.0)
MCH: 30.5 pg (ref 26.0–34.0)
MCHC: 33.6 g/dL (ref 30.0–36.0)
MCV: 90.9 fL (ref 78.0–100.0)
PLATELETS: 203 10*3/uL (ref 150–400)
RBC: 4.95 MIL/uL (ref 4.22–5.81)
RDW: 11.9 % (ref 11.5–15.5)
WBC: 7.3 10*3/uL (ref 4.0–10.5)

## 2016-09-20 LAB — BASIC METABOLIC PANEL
Anion gap: 6 (ref 5–15)
BUN: 25 mg/dL — AB (ref 6–20)
CALCIUM: 10.1 mg/dL (ref 8.9–10.3)
CO2: 33 mmol/L — ABNORMAL HIGH (ref 22–32)
Chloride: 101 mmol/L (ref 101–111)
Creatinine, Ser: 0.99 mg/dL (ref 0.61–1.24)
GFR calc Af Amer: >60 mL/min (ref >60)
GLUCOSE: 255 mg/dL — AB (ref 65–99)
Potassium: 3.9 mmol/L (ref 3.5–5.1)
Sodium: 140 mmol/L (ref 135–145)

## 2016-09-20 LAB — GLUCOSE, CAPILLARY: Glucose-Capillary: 255 mg/dL — ABNORMAL HIGH (ref 65–99)

## 2016-09-20 NOTE — Progress Notes (Signed)
hemaglobin A1 C results routed to dr Tinnie Gensjeffrey beane via epic fax.

## 2016-09-20 NOTE — Progress Notes (Signed)
Cardiac clearance note dr Rennis Goldenhilty 08-23-16 dr hilty on chart

## 2016-09-26 ENCOUNTER — Encounter (HOSPITAL_COMMUNITY)
Admission: RE | Disposition: A | Discharge status: Home / Self Care | Payer: Self-pay | Source: Ambulatory Visit | Attending: Specialist

## 2016-09-26 ENCOUNTER — Ambulatory Visit (HOSPITAL_COMMUNITY): Payer: Worker's Compensation | Admitting: Anesthesiology

## 2016-09-26 ENCOUNTER — Ambulatory Visit (HOSPITAL_COMMUNITY)
Admission: RE | Admit: 2016-09-26 | Discharge: 2016-09-26 | Disposition: A | Discharge status: Home / Self Care | Payer: Worker's Compensation | Source: Ambulatory Visit | Attending: Specialist | Admitting: Specialist

## 2016-09-26 ENCOUNTER — Encounter (HOSPITAL_COMMUNITY): Payer: Self-pay | Admitting: *Deleted

## 2016-09-26 DIAGNOSIS — S76112D Strain of left quadriceps muscle, fascia and tendon, subsequent encounter: Secondary | ICD-10-CM | POA: Insufficient documentation

## 2016-09-26 DIAGNOSIS — E119 Type 2 diabetes mellitus without complications: Secondary | ICD-10-CM | POA: Diagnosis not present

## 2016-09-26 DIAGNOSIS — M2242 Chondromalacia patellae, left knee: Secondary | ICD-10-CM | POA: Diagnosis not present

## 2016-09-26 DIAGNOSIS — I10 Essential (primary) hypertension: Secondary | ICD-10-CM | POA: Insufficient documentation

## 2016-09-26 DIAGNOSIS — X58XXXD Exposure to other specified factors, subsequent encounter: Secondary | ICD-10-CM | POA: Diagnosis not present

## 2016-09-26 DIAGNOSIS — S83242D Other tear of medial meniscus, current injury, left knee, subsequent encounter: Secondary | ICD-10-CM | POA: Diagnosis not present

## 2016-09-26 DIAGNOSIS — I2510 Atherosclerotic heart disease of native coronary artery without angina pectoris: Secondary | ICD-10-CM | POA: Insufficient documentation

## 2016-09-26 DIAGNOSIS — Z87891 Personal history of nicotine dependence: Secondary | ICD-10-CM | POA: Diagnosis not present

## 2016-09-26 DIAGNOSIS — Z79899 Other long term (current) drug therapy: Secondary | ICD-10-CM | POA: Diagnosis not present

## 2016-09-26 DIAGNOSIS — Z7984 Long term (current) use of oral hypoglycemic drugs: Secondary | ICD-10-CM | POA: Diagnosis not present

## 2016-09-26 DIAGNOSIS — M1712 Unilateral primary osteoarthritis, left knee: Secondary | ICD-10-CM | POA: Diagnosis not present

## 2016-09-26 HISTORY — PX: QUADRICEPS TENDON REPAIR: SHX756

## 2016-09-26 LAB — GLUCOSE, CAPILLARY
GLUCOSE-CAPILLARY: 192 mg/dL — AB (ref 65–99)
GLUCOSE-CAPILLARY: 196 mg/dL — AB (ref 65–99)
Glucose-Capillary: 204 mg/dL — ABNORMAL HIGH (ref 65–99)

## 2016-09-26 SURGERY — REPAIR, TENDON, QUADRICEPS
Anesthesia: General | Site: Knee | Laterality: Left

## 2016-09-26 MED ORDER — PHENYLEPHRINE HCL 10 MG/ML IJ SOLN
INTRAMUSCULAR | Status: AC
  Filled 2016-09-26: qty 1

## 2016-09-26 MED ORDER — CANAGLIFLOZIN 100 MG PO TABS: 100.0000 mg | ORAL_TABLET | Freq: Every day | ORAL | Status: DC

## 2016-09-26 MED ORDER — OXYCODONE HCL 5 MG PO TABS: 5.0000 mg | ORAL_TABLET | Freq: Once | ORAL | Status: DC | PRN

## 2016-09-26 MED ORDER — FENTANYL CITRATE (PF) 100 MCG/2ML IJ SOLN
INTRAMUSCULAR | Status: DC | PRN
  Administered 2016-09-26 (×2): 100 ug via INTRAVENOUS

## 2016-09-26 MED ORDER — ONDANSETRON HCL 4 MG/2ML IJ SOLN: 4.0000 mg | Freq: Four times a day (QID) | INTRAMUSCULAR | Status: DC | PRN

## 2016-09-26 MED ORDER — LOSARTAN POTASSIUM 50 MG PO TABS: 100.0000 mg | ORAL_TABLET | Freq: Every day | ORAL | Status: DC

## 2016-09-26 MED ORDER — OXYCODONE HCL 5 MG PO TABS
5.0000 mg | ORAL_TABLET | ORAL | Status: DC | PRN
  Administered 2016-09-26 (×2): 10 mg via ORAL
  Filled 2016-09-26 (×2): qty 2

## 2016-09-26 MED ORDER — ASPIRIN EC 325 MG PO TBEC: 325.0000 mg | DELAYED_RELEASE_TABLET | Freq: Two times a day (BID) | ORAL | 1 refills | Status: AC

## 2016-09-26 MED ORDER — LIDOCAINE 2% (20 MG/ML) 5 ML SYRINGE
INTRAMUSCULAR | Status: AC
  Filled 2016-09-26: qty 5

## 2016-09-26 MED ORDER — METHOCARBAMOL 500 MG PO TABS
500.0000 mg | ORAL_TABLET | Freq: Four times a day (QID) | ORAL | Status: DC | PRN
  Administered 2016-09-26: 500 mg via ORAL
  Filled 2016-09-26: qty 1

## 2016-09-26 MED ORDER — ROCURONIUM BROMIDE 50 MG/5ML IV SOSY
PREFILLED_SYRINGE | INTRAVENOUS | Status: AC
  Filled 2016-09-26: qty 5

## 2016-09-26 MED ORDER — SUGAMMADEX SODIUM 200 MG/2ML IV SOLN
INTRAVENOUS | Status: DC | PRN
  Administered 2016-09-26: 300 mg via INTRAVENOUS

## 2016-09-26 MED ORDER — LIDOCAINE 2% (20 MG/ML) 5 ML SYRINGE
INTRAMUSCULAR | Status: DC | PRN
  Administered 2016-09-26: 100 mg via INTRAVENOUS

## 2016-09-26 MED ORDER — POLYETHYLENE GLYCOL 3350 17 G PO PACK: 17.0000 g | PACK | Freq: Every day | ORAL | 0 refills | Status: AC

## 2016-09-26 MED ORDER — ACETAMINOPHEN 325 MG PO TABS: 650.0000 mg | ORAL_TABLET | Freq: Four times a day (QID) | ORAL | Status: DC | PRN

## 2016-09-26 MED ORDER — SUCCINYLCHOLINE CHLORIDE 200 MG/10ML IV SOSY
PREFILLED_SYRINGE | INTRAVENOUS | Status: DC | PRN
  Administered 2016-09-26: 160 mg via INTRAVENOUS

## 2016-09-26 MED ORDER — SODIUM CHLORIDE 0.9 % IR SOLN
Status: AC
  Filled 2016-09-26: qty 500000

## 2016-09-26 MED ORDER — MIDAZOLAM HCL 2 MG/2ML IJ SOLN
INTRAMUSCULAR | Status: DC | PRN
  Administered 2016-09-26: 2 mg via INTRAVENOUS

## 2016-09-26 MED ORDER — MIDAZOLAM HCL 2 MG/2ML IJ SOLN
INTRAMUSCULAR | Status: AC
  Filled 2016-09-26: qty 2

## 2016-09-26 MED ORDER — ONDANSETRON HCL 4 MG/2ML IJ SOLN
INTRAMUSCULAR | Status: DC | PRN
  Administered 2016-09-26: 4 mg via INTRAVENOUS

## 2016-09-26 MED ORDER — PHENYLEPHRINE 40 MCG/ML (10ML) SYRINGE FOR IV PUSH (FOR BLOOD PRESSURE SUPPORT)
PREFILLED_SYRINGE | INTRAVENOUS | Status: DC | PRN
  Administered 2016-09-26: 120 ug via INTRAVENOUS
  Administered 2016-09-26: 80 ug via INTRAVENOUS

## 2016-09-26 MED ORDER — BISACODYL 5 MG PO TBEC: 5.0000 mg | DELAYED_RELEASE_TABLET | Freq: Every day | ORAL | Status: DC | PRN

## 2016-09-26 MED ORDER — PROPOFOL 10 MG/ML IV BOLUS
INTRAVENOUS | Status: DC | PRN
  Administered 2016-09-26: 200 mg via INTRAVENOUS

## 2016-09-26 MED ORDER — METOCLOPRAMIDE HCL 5 MG PO TABS: 5.0000 mg | ORAL_TABLET | Freq: Three times a day (TID) | ORAL | Status: DC | PRN

## 2016-09-26 MED ORDER — AMLODIPINE BESYLATE 5 MG PO TABS: 5.0000 mg | ORAL_TABLET | Freq: Every day | ORAL | Status: DC

## 2016-09-26 MED ORDER — OXYCODONE HCL 5 MG/5ML PO SOLN
5.0000 mg | Freq: Once | ORAL | Status: DC | PRN
  Filled 2016-09-26: qty 5

## 2016-09-26 MED ORDER — OXYCODONE-ACETAMINOPHEN 10-325 MG PO TABS: 1.0000 | ORAL_TABLET | ORAL | 0 refills | Status: AC | PRN

## 2016-09-26 MED ORDER — BUPIVACAINE-EPINEPHRINE 0.5% -1:200000 IJ SOLN
INTRAMUSCULAR | Status: DC | PRN
  Administered 2016-09-26: 10 mL

## 2016-09-26 MED ORDER — SUCCINYLCHOLINE CHLORIDE 200 MG/10ML IV SOSY
PREFILLED_SYRINGE | INTRAVENOUS | Status: AC
  Filled 2016-09-26: qty 10

## 2016-09-26 MED ORDER — CEFAZOLIN SODIUM-DEXTROSE 2-4 GM/100ML-% IV SOLN
2.0000 g | Freq: Four times a day (QID) | INTRAVENOUS | Status: DC
  Administered 2016-09-26: 14:00:00 2 g via INTRAVENOUS
  Filled 2016-09-26: qty 100

## 2016-09-26 MED ORDER — BUPIVACAINE-EPINEPHRINE (PF) 0.5% -1:200000 IJ SOLN
INTRAMUSCULAR | Status: AC
  Filled 2016-09-26: qty 30

## 2016-09-26 MED ORDER — DOCUSATE SODIUM 100 MG PO CAPS: 100.0000 mg | ORAL_CAPSULE | Freq: Two times a day (BID) | ORAL | Status: DC

## 2016-09-26 MED ORDER — POLYETHYLENE GLYCOL 3350 17 G PO PACK: 17.0000 g | PACK | Freq: Every day | ORAL | Status: DC | PRN

## 2016-09-26 MED ORDER — CHLORTHALIDONE 25 MG PO TABS: 25.0000 mg | ORAL_TABLET | Freq: Every day | ORAL | Status: DC

## 2016-09-26 MED ORDER — CEFAZOLIN SODIUM-DEXTROSE 2-4 GM/100ML-% IV SOLN
2.0000 g | INTRAVENOUS | Status: AC
Start: 2016-09-26 — End: 2016-09-26
  Administered 2016-09-26: 2 g via INTRAVENOUS

## 2016-09-26 MED ORDER — ONDANSETRON HCL 4 MG/2ML IJ SOLN
4.0000 mg | Freq: Four times a day (QID) | INTRAMUSCULAR | Status: DC | PRN
Start: 2016-09-26 — End: 2016-09-26

## 2016-09-26 MED ORDER — KETOROLAC TROMETHAMINE 30 MG/ML IJ SOLN
INTRAMUSCULAR | Status: AC
  Filled 2016-09-26: qty 2

## 2016-09-26 MED ORDER — LACTATED RINGERS IV SOLN
INTRAVENOUS | Status: DC
  Administered 2016-09-26: 07:00:00 via INTRAVENOUS

## 2016-09-26 MED ORDER — METHOCARBAMOL 1000 MG/10ML IJ SOLN
500.0000 mg | Freq: Four times a day (QID) | INTRAVENOUS | Status: DC | PRN
  Filled 2016-09-26 (×2): qty 5

## 2016-09-26 MED ORDER — ONDANSETRON HCL 4 MG/2ML IJ SOLN
INTRAMUSCULAR | Status: AC
  Filled 2016-09-26: qty 2

## 2016-09-26 MED ORDER — ROCURONIUM BROMIDE 10 MG/ML (PF) SYRINGE
PREFILLED_SYRINGE | INTRAVENOUS | Status: DC | PRN
  Administered 2016-09-26: 50 mg via INTRAVENOUS

## 2016-09-26 MED ORDER — RISAQUAD PO CAPS: 1.0000 | ORAL_CAPSULE | Freq: Every day | ORAL | Status: DC

## 2016-09-26 MED ORDER — METHOCARBAMOL 500 MG PO TABS: 500.0000 mg | ORAL_TABLET | Freq: Four times a day (QID) | ORAL | 1 refills | Status: AC | PRN

## 2016-09-26 MED ORDER — LACTATED RINGERS IV SOLN
INTRAVENOUS | Status: DC | PRN
  Administered 2016-09-26: 6000 mL

## 2016-09-26 MED ORDER — EPINEPHRINE PF 1 MG/ML IJ SOLN
INTRAMUSCULAR | Status: AC
  Filled 2016-09-26: qty 1

## 2016-09-26 MED ORDER — SODIUM CHLORIDE 0.9 % IR SOLN
Status: DC | PRN
  Administered 2016-09-26: 500 mL

## 2016-09-26 MED ORDER — ASPIRIN 325 MG PO TABS: 325.0000 mg | ORAL_TABLET | Freq: Every day | ORAL | Status: DC

## 2016-09-26 MED ORDER — INSULIN ASPART 100 UNIT/ML ~~LOC~~ SOLN
0.0000 [IU] | Freq: Three times a day (TID) | SUBCUTANEOUS | Status: DC
  Administered 2016-09-26: 12:00:00 3 [IU] via SUBCUTANEOUS

## 2016-09-26 MED ORDER — FENTANYL CITRATE (PF) 100 MCG/2ML IJ SOLN
INTRAMUSCULAR | Status: AC
  Filled 2016-09-26: qty 2

## 2016-09-26 MED ORDER — CEFAZOLIN SODIUM-DEXTROSE 2-4 GM/100ML-% IV SOLN
INTRAVENOUS | Status: AC
  Filled 2016-09-26: qty 100

## 2016-09-26 MED ORDER — HYDROMORPHONE HCL 1 MG/ML IJ SOLN: 1.0000 mg | INTRAMUSCULAR | Status: DC | PRN

## 2016-09-26 MED ORDER — SUGAMMADEX SODIUM 500 MG/5ML IV SOLN
INTRAVENOUS | Status: AC
  Filled 2016-09-26: qty 5

## 2016-09-26 MED ORDER — ONDANSETRON HCL 4 MG PO TABS: 4.0000 mg | ORAL_TABLET | Freq: Four times a day (QID) | ORAL | Status: DC | PRN

## 2016-09-26 MED ORDER — ACETAMINOPHEN 650 MG RE SUPP: 650.0000 mg | Freq: Four times a day (QID) | RECTAL | Status: DC | PRN

## 2016-09-26 MED ORDER — EPHEDRINE SULFATE-NACL 50-0.9 MG/10ML-% IV SOSY
PREFILLED_SYRINGE | INTRAVENOUS | Status: DC | PRN
  Administered 2016-09-26: 10 mg via INTRAVENOUS

## 2016-09-26 MED ORDER — HYDROMORPHONE HCL 1 MG/ML IJ SOLN: 0.2500 mg | INTRAMUSCULAR | Status: DC | PRN

## 2016-09-26 MED ORDER — PHENYLEPHRINE HCL 10 MG/ML IJ SOLN
INTRAMUSCULAR | Status: DC | PRN
  Administered 2016-09-26: 50 ug/min via INTRAVENOUS

## 2016-09-26 MED ORDER — KETOROLAC TROMETHAMINE 30 MG/ML IJ SOLN
INTRAMUSCULAR | Status: DC | PRN
  Administered 2016-09-26: 30 mg via INTRAVENOUS
  Administered 2016-09-26: 30 mg via INTRAMUSCULAR

## 2016-09-26 MED ORDER — METOCLOPRAMIDE HCL 5 MG/ML IJ SOLN: 5.0000 mg | Freq: Three times a day (TID) | INTRAMUSCULAR | Status: DC | PRN

## 2016-09-26 MED ORDER — DOCUSATE SODIUM 100 MG PO CAPS: 100.0000 mg | ORAL_CAPSULE | Freq: Two times a day (BID) | ORAL | 1 refills | Status: AC | PRN

## 2016-09-26 SURGICAL SUPPLY — 61 items
BAG ZIPLOCK 12X15 (MISCELLANEOUS) IMPLANT
BANDAGE ACE 4X5 VEL STRL LF (GAUZE/BANDAGES/DRESSINGS) ×4 IMPLANT
BANDAGE ACE 6X5 VEL STRL LF (GAUZE/BANDAGES/DRESSINGS) ×4 IMPLANT
BANDAGE ESMARK 6X9 LF (GAUZE/BANDAGES/DRESSINGS) ×2 IMPLANT
BIT DRILL 2.4X128 (BIT) IMPLANT
BIT DRILL 2.4X128MM (BIT)
BIT DRILL 2.8X128 (BIT) IMPLANT
BIT DRILL 2.8X128MM (BIT)
BNDG ELASTIC 6X10 VLCR STRL LF (GAUZE/BANDAGES/DRESSINGS) ×4 IMPLANT
BNDG ESMARK 6X9 LF (GAUZE/BANDAGES/DRESSINGS) ×4
CLOTH 2% CHLOROHEXIDINE 3PK (PERSONAL CARE ITEMS) ×4 IMPLANT
CUFF TOURN SGL QUICK 34 (TOURNIQUET CUFF) ×2
CUFF TRNQT CYL 34X4X40X1 (TOURNIQUET CUFF) ×2 IMPLANT
DRAPE ORTHO SPLIT 77X108 STRL (DRAPES) ×4
DRAPE POUCH INSTRU U-SHP 10X18 (DRAPES) ×4 IMPLANT
DRAPE SHEET LG 3/4 BI-LAMINATE (DRAPES) IMPLANT
DRAPE SURG ORHT 6 SPLT 77X108 (DRAPES) ×4 IMPLANT
DRAPE U-SHAPE 47X51 STRL (DRAPES) ×4 IMPLANT
DRSG ADAPTIC 3X8 NADH LF (GAUZE/BANDAGES/DRESSINGS) IMPLANT
DRSG AQUACEL AG ADV 3.5X 6 (GAUZE/BANDAGES/DRESSINGS) ×4 IMPLANT
DRSG AQUACEL AG ADV 3.5X10 (GAUZE/BANDAGES/DRESSINGS) IMPLANT
DRSG MEPILEX BORDER 4X4 (GAUZE/BANDAGES/DRESSINGS) ×8 IMPLANT
DRSG PAD ABDOMINAL 8X10 ST (GAUZE/BANDAGES/DRESSINGS) IMPLANT
DURAPREP 26ML APPLICATOR (WOUND CARE) ×4 IMPLANT
ELECT REM PT RETURN 15FT ADLT (MISCELLANEOUS) ×4 IMPLANT
GAUZE SPONGE 4X4 12PLY STRL (GAUZE/BANDAGES/DRESSINGS) IMPLANT
GLOVE BIOGEL PI IND STRL 7.0 (GLOVE) ×2 IMPLANT
GLOVE BIOGEL PI IND STRL 8 (GLOVE) ×2 IMPLANT
GLOVE BIOGEL PI INDICATOR 7.0 (GLOVE) ×2
GLOVE BIOGEL PI INDICATOR 8 (GLOVE) ×2
GLOVE SURG SS PI 7.0 STRL IVOR (GLOVE) ×4 IMPLANT
GLOVE SURG SS PI 7.5 STRL IVOR (GLOVE) ×4 IMPLANT
GLOVE SURG SS PI 8.0 STRL IVOR (GLOVE) ×4 IMPLANT
GOWN STRL REUS W/TWL XL LVL3 (GOWN DISPOSABLE) ×8 IMPLANT
IMMOBILIZER KNEE 20 (SOFTGOODS) ×4
IMMOBILIZER KNEE 20 THIGH 36 (SOFTGOODS) ×2 IMPLANT
KIT BASIN OR (CUSTOM PROCEDURE TRAY) ×4 IMPLANT
MANIFOLD NEPTUNE II (INSTRUMENTS) ×4 IMPLANT
NEEDLE HYPO 22GX1.5 SAFETY (NEEDLE) ×4 IMPLANT
NEEDLE MA TROC 1/2 CIR (NEEDLE) IMPLANT
NEEDLE MAYO CATGUT SZ4 (NEEDLE) IMPLANT
PACK TOTAL JOINT (CUSTOM PROCEDURE TRAY) ×4 IMPLANT
PADDING CAST COTTON 6X4 STRL (CAST SUPPLIES) IMPLANT
PASSER SUT SWANSON 36MM LOOP (INSTRUMENTS) IMPLANT
POSITIONER SURGICAL ARM (MISCELLANEOUS) ×4 IMPLANT
STAPLER VISISTAT (STAPLE) ×4 IMPLANT
SUT ETHIBOND 5 LR DA (SUTURE) IMPLANT
SUT ETHIBOND NAB CT1 #1 30IN (SUTURE) ×4 IMPLANT
SUT ETHILON 3 0 PS 1 (SUTURE) ×4 IMPLANT
SUT FIBERWIRE #2 38 T-5 BLUE (SUTURE)
SUT VIC AB 0 CT1 27 (SUTURE)
SUT VIC AB 0 CT1 27XBRD ANTBC (SUTURE) IMPLANT
SUT VIC AB 1 CT1 27 (SUTURE) ×4
SUT VIC AB 1 CT1 27XBRD ANTBC (SUTURE) ×4 IMPLANT
SUT VIC AB 2-0 CT1 27 (SUTURE) ×4
SUT VIC AB 2-0 CT1 TAPERPNT 27 (SUTURE) ×4 IMPLANT
SUTURE FIBERWR #2 38 T-5 BLUE (SUTURE) IMPLANT
SYR 20CC LL (SYRINGE) ×4 IMPLANT
TOWEL OR 17X26 10 PK STRL BLUE (TOWEL DISPOSABLE) ×4 IMPLANT
TOWEL OR NON WOVEN STRL DISP B (DISPOSABLE) IMPLANT
WRAP KNEE MAXI GEL POST OP (GAUZE/BANDAGES/DRESSINGS) ×4 IMPLANT

## 2016-09-26 NOTE — Interval H&P Note (Signed)
History and Physical Interval Note:  09/26/2016 7:15 AM  Robert Sweeney  has presented today for surgery, with the diagnosis of Quad tendon tear, medial menisectomy tear left knee  The various methods of treatment have been discussed with the patient and family. After consideration of risks, benefits and other options for treatment, the patient has consented to  Procedure(s): LEFT KNEE ARTHROSCOPY, PARTIAL MEDIAL MENISECTOMY, DEBRIDEMENT, REVISION QUAD TENDON REPAIR WITH PATCH GRAFT (Left) LEFT KNEE ARTHROSCOPY WITH PARTIAL MEDIAL MENISECTOMY (Left) as a surgical intervention .  The patient's history has been reviewed, patient examined, no change in status, stable for surgery.  I have reviewed the patient's chart and labs.  Questions were answered to the patient's satisfaction.     Delise Simenson C

## 2016-09-26 NOTE — H&P (View-Only) (Signed)
Robert Sweeney is an 56 y.o. male.   Chief Complaint: Left knee pain HPI: The patient is a 56 year old male who presents today for follow up of their knee. The patient is being followed for their left quadriceps tendon repair. They are now nearly 2 years out from surgery (DOS 09/22/14). The patient is 2 year, 3 months out from the injury (DOI 06/21/2014). Symptoms reported today include: pain. Current treatment includes: activity modification and pain medications. The following medication has been used for pain control: Percocet. The patient presents today following MRI. Note for "Follow-up Knee": The patient is out of work. NCM [name].  Robert Sweeney follows up with [name], his case manager. He has had his MRI repeated and it does show a probable meniscus tear medially. 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 Quad tendon shows focus of increased signal in the distal tendon consistent with a low-grade partial tear.  Tendinosis of the proximal patellar tendon is noted as well with a chronic Osgood-Schlatterus ossicle. He reports continued pain in the quadriceps, locking, giving way. He is taking one to two Percocet a day. He has not taken it from any other individual. He is not somnolent from that. He is not combining that with other medications. He indicates without that he is unable to sleep.  He also indicates his A1c has been normalized. He has clearance from his medical physician. Apparently he had two stress tests, one questionable and another one fine. We do now have official cardiac clearance.      Past Medical History:  Diagnosis Date  . Diabetes mellitus without complication (HCC)    type 2  . Headache    sometimes  . History of diabetes mellitus, type II    diet controlled-"no medications needed anymore"  . Hypertension    under control through diet and exercise         Past Surgical History:  Procedure Laterality Date  . NO PAST  SURGERIES    . QUADRICEPS TENDON REPAIR Left 09/22/2014   Procedure: REPAIR LEFT QUADRICEP TENDON with patch graft;  Surgeon: [name], MD;  Location: [facility];  Service: Orthopedics;  Laterality: Left;         Family History  Problem Relation Age of Onset  . Diabetes Mother   . Hypertension Mother    Social History:  reports that he quit smoking about 34 years ago. His smoking use included Cigarettes. He has never used smokeless tobacco. He reports that he drinks alcohol. He reports that he does not use drugs.  Allergies: No Known Allergies   (Not in a hospital admission)  LabResultsLast48Hours  No results found for this or any previous visit (from the past 48 hour(s)).   ImagingResults(Last48hours)  No results found.    Review of Systems  Constitutional: Negative.   HENT: Negative.   Eyes: Negative.   Respiratory: Negative.   Cardiovascular: Negative.   Gastrointestinal: Negative.   Genitourinary: Negative.   Musculoskeletal: Positive for joint pain.  Skin: Negative.   Neurological: Negative.   Psychiatric/Behavioral: Negative.     There were no vitals taken for this visit. Physical Exam  Constitutional: He is oriented to person, place, and time. He appears well-developed.  HENT:  Head: Normocephalic.  Eyes: Pupils are equal, round, and reactive to light.  Neck: Normal range of motion.  Cardiovascular: Normal rate.   Respiratory: Effort normal.  GI: Soft.  Musculoskeletal:  On exam, he is tender along the distal quadriceps  tendon, fairly severely. He is able to perform an active straight leg raise, however. He is tender along the medial joint line. Equivocal McMmurray. Trace effusion.  Knee exam on inspection reveals no evidence of soft tissue swelling, ecchymosis, deformity or erythema. On palpation there is no tenderness in the medial and lateral joint line. No patellofemoral pain with compression. Nontender over the fibular head or the  peroneal nerve. The range of motion was full. Provocative maneuvers revealed a negative Lachman, negative anterior and posterior drawer. No instability was noted with varus and valgus stressing at 0 or 30 degrees. On manual motor test the quadriceps and hamstrings were 5/5. Sensory exam was intact to light touch.  Neurological: He is alert and oriented to person, place, and time.  Skin: Skin is warm and dry.     Assessment/Plan 1. 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 2. Probable symptomatic medial meniscus tear. 3. Diabetes, reportedly well under control. 4. Questionable cardiac history.  At this point in time, we discussed the options again proceeding with surgical intervention. The MRI update shows no significant changes from the previous, so our previous recommendation stand.  We discussed the surgery again in detail, the patch graft, postoperative recovery, arthroscopy prior to that to evaluate for meniscal pathology and the patellofemoral arthrosis and the importance to optimize his blood glucose postoperatively as that is an indicator of healing. Previously he had suboptimal blood glucose control and I believe that may have contributed to his re-tear.  I would recommend reconciliation of his cardiac condition. He still has not seen a cardiologist. Apparently, there is a barrier to that. One other option maybe to have a preoperative consultation with anesthesia to further discuss his current symptoms, condition and cardiac risk to determine whether any additional studies need to be performed and then we will proceed accordingly. Continue his out of work. I discussed this in front of the patient with [name] his case manager indicating the pathology, relevant anatomy and treatment.  We spent up to four minutes discussing all of these issues separately and in front of the patient with [name], case manager. Hopefully, we can proceed in a timely fashion. We discussed  postoperative course, three months to maximum medical improvement engaging in physical therapy. I refilled his Percocet, again to be used sparingly, mainly at night. Again assured me he is not receiving it from another source. He is not somnolent secondary to that. This is well tolerated.  Plan Left knee arthroscopy, partial medial meniscectomy, debridement, revision quad tendon repair with patch graft  Yona Stansbury, Dayna Barker., PA-C for Dr. Shelle Iron

## 2016-09-26 NOTE — Discharge Summary (Signed)
Patient ID: Robert Sweeney MRN: [id number] DOB/AGE: March 28, 1961 56 y.o.  Admit date: 09/26/2016 Discharge date: 09/26/2016  Admission Diagnoses:  Active Problems:   Left knee DJD   Discharge Diagnoses:  Same  Past Medical History:  Diagnosis Date  . Abnormal stress test    a. 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  . Diabetes mellitus without complication ([place])    type 2  . Headache    sometimes, occ migraines  . History of diabetes mellitus, type II    diet controlled-"no medications needed anymore"  . Hypertension    under control through diet and exercise    Surgeries: Procedure(s): LEFT KNEE ARTHROSCOPY, PARTIAL MEDIAL MENISECTOMY, DEBRIDEMENT, QUAD TENDON REPAIR on 09/26/2016   Consultants:   Discharged Condition: Improved  Hospital Course: Robert Sweeney is an 56 y.o. male who was admitted 09/26/2016 for operative treatment of<principal problem not specified>. Patient has severe unremitting pain that affects sleep, daily activities, and work/hobbies. After pre-op clearance the patient was taken to the operating room on 09/26/2016 and underwent  Procedure(s): LEFT KNEE ARTHROSCOPY, PARTIAL MEDIAL MENISECTOMY, DEBRIDEMENT, QUAD TENDON REPAIR.    Patient was given perioperative antibiotics: Anti-infectives    Start     Dose/Rate Route Frequency Ordered Stop   09/26/16 1400  ceFAZolin (ANCEF) IVPB 2g/100 mL premix     2 g 200 mL/hr over 30 Minutes Intravenous Every 6 hours 09/26/16 1018 09/27/16 0759   09/26/16 0812  polymyxin B 500,000 Units, bacitracin 50,000 Units in sodium chloride irrigation 0.9 % 500 mL irrigation  Status:  Discontinued       As needed 09/26/16 0812 09/26/16 0900   09/26/16 0636  ceFAZolin (ANCEF) 2-4 GM/100ML-% IVPB    Comments:  Danley Danker   : cabinet override      09/26/16 0636 09/26/16 1844   09/26/16 0600  ceFAZolin (ANCEF) IVPB 2g/100 mL premix      2 g 200 mL/hr over 30 Minutes Intravenous On call to O.R. 09/26/16 0521 09/26/16 0730       Patient was given sequential compression devices, early ambulation, and chemoprophylaxis to prevent DVT.  Patient benefited maximally from hospital stay and there were no complications.    Recent vital signs: Patient Vitals for the past 24 hrs:  BP Temp Temp src Pulse Resp SpO2 Height Weight  09/26/16 1318 (!) 141/67 98.1 F (36.7 C) Oral 67 16 98 % - -  09/26/16 1205 134/67 97.7 F (36.5 C) Oral - 16 96 % - -  09/26/16 1100 127/66 97.7 F (36.5 C) Oral - 16 95 % - -  09/26/16 1012 (!) 149/70 99.3 F (37.4 C) - - (!) 22 - - -  09/26/16 1000 139/72 - - 74 (!) 21 98 % - -  09/26/16 0945 (!) 141/74 - - 74 19 98 % - -  09/26/16 0930 136/78 - - 79 20 100 % - -  09/26/16 0915 140/77 - - 86 (!) 21 100 % - -  09/26/16 0906 - - - 87 (!) 26 99 % - -  09/26/16 0904 (!) 142/79 98.4 F (36.9 C) - 88 (!) 22 98 % - -  09/26/16 0533 (!) 155/62 98.2 F (36.8 C) Oral 96 18 96 % - -  09/26/16 0531 - - - - - - _0  (1.956 m) 118.4 kg (261 lb)     Recent laboratory studies: No results for input(s): WBC, HGB,  HCT, PLT, NA, K, CL, CO2, BUN, CREATININE, GLUCOSE, INR, CALCIUM in the last 72 hours.  Invalid input(s): PT, 2   Discharge Medications:   Allergies as of 09/26/2016   No Known Allergies     Medication List    STOP taking these medications   oxyCODONE-acetaminophen 7.5-325 MG tablet Commonly known as:  PERCOCET Replaced by:  oxyCODONE-acetaminophen 10-325 MG tablet     TAKE these medications   amLODipine 5 MG tablet Commonly known as:  NORVASC Take 1 tablet (5 mg total) by mouth daily.   aspirin EC 325 MG tablet Take 1 tablet (325 mg total) by mouth 2 (two) times daily.   atorvastatin 20 MG tablet Commonly known as:  LIPITOR Take 1 tablet (20 mg total) by mouth daily.   b complex vitamins tablet Take 1 tablet by mouth daily at 12 noon. Reported on 11/07/2015   BEE POLLEN  PO Take 1 capsule by mouth daily at 12 noon. Reported on 11/07/2015   blood glucose meter kit and supplies Kit Dispense based on patient and insurance preference. Check once a day at differing times   chlorthalidone 25 MG tablet Commonly known as:  HYGROTON Take 1 tablet (25 mg total) by mouth daily. What changed:  when to take this   Cinnamon 500 MG Tabs Take 500 mg by mouth daily at 12 noon. Reported on 11/07/2015   CO Q 10 PO Take 1 capsule by mouth daily. Reported on 11/07/2015   dapagliflozin propanediol 10 MG Tabs tablet Commonly known as:  FARXIGA Take 10 mg by mouth daily. What changed:  when to take this   docusate sodium 100 MG capsule Commonly known as:  COLACE Take 1 capsule (100 mg total) by mouth 2 (two) times daily as needed for mild constipation.   losartan 100 MG tablet Commonly known as:  COZAAR Take 1 tablet (100 mg total) by mouth daily. What changed:  when to take this   metFORMIN 500 MG tablet Commonly known as:  GLUCOPHAGE Take 2 tablets (1,000 mg total) by mouth 2 (two) times daily with a meal.   methocarbamol 500 MG tablet Commonly known as:  ROBAXIN Take 1 tablet (500 mg total) by mouth every 6 (six) hours as needed for muscle spasms.   multivitamin with minerals Tabs tablet Take 1 tablet by mouth daily at 12 noon. Reported on 11/07/2015   oxyCODONE-acetaminophen 10-325 MG tablet Commonly known as:  PERCOCET Take 1 tablet by mouth every 4 (four) hours as needed for pain (severe). Replaces:  oxyCODONE-acetaminophen 7.5-325 MG tablet   polyethylene glycol packet Commonly known as:  MIRALAX / GLYCOLAX Take 17 g by mouth daily.   sitaGLIPtin 100 MG tablet Commonly known as:  JANUVIA Take 1 tablet (100 mg total) by mouth daily. What changed:  when to take this   zinc gluconate 50 MG tablet Take 50 mg by mouth daily at 12 noon. Reported on 11/07/2015       Diagnostic Studies: No results found.  Disposition: 01-Home or Self Care  Discharge  Instructions    Call MD / Call 911    Complete by:  As directed    If you experience chest pain or shortness of breath, CALL 911 and be transported to the hospital emergency room.  If you develope a fever above 101 F, pus (white drainage) or increased drainage or redness at the wound, or calf pain, call your surgeon's office.   Constipation Prevention    Complete by:  As directed  Drink plenty of fluids.  Prune juice may be helpful.  You may use a stool softener, such as Colace (over the counter) 100 mg twice a day.  Use MiraLax (over the counter) for constipation as needed.   Diet - low sodium heart healthy    Complete by:  As directed    Increase activity slowly as tolerated    Complete by:  As directed       Follow-up Information    BEANE,JEFFREY C, MD In 2 weeks.   Specialty:  Orthopedic Surgery Contact information: [address] [place] [address] [phone]            Signed: Cecilie Kicks. 09/26/2016, 3:00 PM

## 2016-09-26 NOTE — Evaluation (Signed)
Physical Therapy Evaluation Patient Details Name: Robert Sweeney MRN: [id number] DOB: [birth year] Today's Date: 09/26/2016   History of Present Illness  arthroscopy and tendon repair left knee  Clinical Impression  Able to ambulate with crutches. Ready for  DC.    Follow Up Recommendations No PT follow up    Equipment Recommendations  Crutches    Recommendations for Other Services       Precautions / Restrictions Precautions Required Braces or Orthoses: Knee Immobilizer - Left Knee Immobilizer - Left: On at all times      Mobility  Bed Mobility Overal bed mobility: Independent                Transfers Overall transfer level: Modified independent                  Ambulation/Gait Ambulation/Gait assistance: Modified independent (Device/Increase time) Ambulation Distance (Feet): 150 Feet Assistive device: Crutches Gait Pattern/deviations: Step-to pattern        Stairs Stairs: Yes       General stair comments: reviewed with the wife, steps with crutch and a rail as the patient declined.  Wheelchair Mobility    Modified Rankin (Stroke Patients Only)       Balance                                             Pertinent Vitals/Pain Pain Assessment: 0-10 Pain Score: 7  Pain Location: left knee Pain Descriptors / Indicators: Aching Pain Intervention(s): Monitored during session;Patient requesting pain meds-RN notified    Home Living Family/patient expects to be discharged to:: Private residence Living Arrangements: Spouse/significant other             Home Equipment: None      Prior Function                 Hand Dominance        Extremity/Trunk Assessment   Upper Extremity Assessment Upper Extremity Assessment: Overall WFL for tasks assessed    Lower Extremity Assessment Lower Extremity Assessment: LLE deficits/detail LLE Deficits / Details: in KI, able to SLR       Communication       Cognition Arousal/Alertness: Awake/alert Behavior During Therapy: WFL for tasks assessed/performed Overall Cognitive Status: Within Functional Limits for tasks assessed                      General Comments      Exercises     Assessment/Plan    PT Assessment Patent does not need any further PT services  PT Problem List         PT Treatment Interventions      PT Goals (Current goals can be found in the Care Plan section)  Acute Rehab PT Goals Patient Stated Goal: go home PT Goal Formulation: All assessment and education complete, DC therapy    Frequency     Barriers to discharge        Co-evaluation               End of Session Equipment Utilized During Treatment: Left knee immobilizer Activity Tolerance: Patient tolerated treatment well Patient left: in bed;with call bell/phone within reach;with family/visitor present Nurse Communication: Mobility status      Functional Assessment Tool Used: Clinical judgement;AM-PAC 6 Clicks Basic Mobility Functional Limitation: Mobility: Walking and moving around Mobility:  Walking and Moving Around Current Status [phone]): 0 percent impaired, limited or restricted Mobility: Walking and Moving Around Goal Status [phone]): 0 percent impaired, limited or restricted Mobility: Walking and Moving Around Discharge Status [phone]): 0 percent impaired, limited or restricted    Time: 1400-1423 PT Time Calculation (min) (ACUTE ONLY): 23 min   Charges:   PT Evaluation $PT Eval Low Complexity: 1 Procedure PT Treatments $Gait Training: 8-22 mins   PT G Codes:   PT G-Codes **NOT FOR INPATIENT CLASS** Functional Assessment Tool Used: Clinical judgement;AM-PAC 6 Clicks Basic Mobility Functional Limitation: Mobility: Walking and moving around Mobility: Walking and Moving Around Current Status (B1478): 0 percent impaired, limited or restricted Mobility: Walking and Moving Around Goal Status (G9562): 0 percent impaired, limited  or restricted Mobility: Walking and Moving Around Discharge Status [phone]): 0 percent impaired, limited or restricted     Sharen Heck PT [phone]  09/26/2016, 5:11 PM

## 2016-09-26 NOTE — Care Management Note (Addendum)
Case Management Note  Patient Details  Name: Robert Sweeney MRN: [id number] Date of Birth: 12/20/1960  Subjective/Objective:                  LEFT KNEE ARTHROSCOPY, PARTIAL MEDIAL MENISECTOMY, DEBRIDEMENT, QUAD TENDON REPAIR (Left) Action/Plan: Discharge planning Expected Discharge Date:  09/26/16               Expected Discharge Plan:  Home/Self Care  In-House Referral:     Discharge planning Services  CM Consult  Post Acute Care Choice:  NA Choice offered to:  Patient  DME Arranged:  N/A DME Agency:  NA  HH Arranged:  NA HH Agency:  NA  Status of Service:  Completed, signed off  If discussed at [place] of Stay Meetings, dates discussed:    Additional Comments: 16:19 CM received confirmation of receipt. CM met with pt after speaking with PT, Santiago Glad who states pt is doing fine with crutches but still wishes for a rolling walker.  CM spoke with CM Marius Ditch who requests I fax the order to her at [phone] and she will airdrop to pt's home address.  No other CM needs were communicated.  Dellie Catholic, RN 09/26/2016, 3:14 PM

## 2016-09-26 NOTE — Anesthesia Preprocedure Evaluation (Signed)
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   Neuro/Psych  Headaches,    GI/Hepatic   Endo/Other  diabetes, Type 2  Renal/GU      Musculoskeletal   Abdominal   Peds  Hematology   Anesthesia Other Findings   Reproductive/Obstetrics                             Anesthesia Physical Anesthesia Plan  ASA: III  Anesthesia Plan: General   Post-op Pain Management:    Induction: Intravenous  Airway Management Planned: Oral ETT  Additional Equipment:   Intra-op Plan:   Post-operative Plan: Extubation in OR  Informed Consent: I have reviewed the patients History and Physical, chart, labs and discussed the procedure including the risks, benefits and alternatives for the proposed anesthesia with the patient or authorized representative who has indicated his/her understanding and acceptance.     Plan Discussed with: CRNA, Anesthesiologist and Surgeon  Anesthesia Plan Comments:         Anesthesia Quick Evaluation

## 2016-09-26 NOTE — Anesthesia Postprocedure Evaluation (Addendum)
Anesthesia Post Note  Patient: Robert BranchDavid [name]  Procedure(s) Performed: Procedure(s) (LRB): LEFT KNEE ARTHROSCOPY, PARTIAL MEDIAL MENISECTOMY, DEBRIDEMENT, QUAD TENDON REPAIR (Left)  Patient location during evaluation: PACU Anesthesia Type: General Level of consciousness: awake and alert and patient cooperative Pain management: pain level controlled Vital Signs Assessment: post-procedure vital signs reviewed and stable Respiratory status: spontaneous breathing and respiratory function stable Cardiovascular status: stable Anesthetic complications: no       Last Vitals:  Vitals:   09/26/16 0930 09/26/16 0945  BP: 136/78 (!) 141/74  Pulse: 79 74  Resp: 20 19  Temp:      Last Pain:  Vitals:   09/26/16 0533  TempSrc: Oral  PainSc:                  Fabianna Keats S

## 2016-09-26 NOTE — Anesthesia Procedure Notes (Signed)
Procedure Name: Intubation Date/Time: 09/26/2016 7:28 AM Performed by: Leroy LibmanEARDON, Paelyn Smick L Patient Re-evaluated:Patient Re-evaluated prior to inductionOxygen Delivery Method: Circle system utilized Preoxygenation: Pre-oxygenation with 100% oxygen Intubation Type: IV induction Ventilation: Mask ventilation without difficulty and Oral airway inserted - appropriate to patient size Laryngoscope Size: Miller and 3 Grade View: Grade I Tube type: Oral Tube size: 8.0 mm Number of attempts: 1 Airway Equipment and Method: Stylet Placement Confirmation: ETT inserted through vocal cords under direct vision,  positive ETCO2 and breath sounds checked- equal and bilateral Secured at: 23 cm Tube secured with: Tape Dental Injury: Teeth and Oropharynx as per pre-operative assessment

## 2016-09-26 NOTE — Op Note (Signed)
NAME:  Robert Sweeney, Robert Sweeney                       ACCOUNT NO.:  MEDICAL RECORD NO.:  [id number]  LOCATION:                                 FACILITY:  PHYSICIAN:  Robert Sweeney, M.D.         DATE OF BIRTH:  DATE OF PROCEDURE:  09/26/2016 DATE OF DISCHARGE:                              OPERATIVE REPORT   PREOPERATIVE DIAGNOSES: 1. Recurrent nonhealing quadriceps tendon tear. 2. Medial meniscus tear. 3. Degenerative joint disease of the left knee.  POSTOPERATIVE DIAGNOSES: 1. Grade 3, near grade 4 lesion in poster medial femoral condyle. 2. Medial meniscus tear. 3. Chondromalacia of the patellofemoral joint. 4. Quadriceps tendon tear, recurrent.  PROCEDURES PERFORMED: 1. Left knee arthroscopy. 2. Revision partial medial meniscectomy. 3. Chondroplasty of the medial femoral condyle, patella and the     femoral sulcus. 4. Revision open quadriceps tendon repair.  ANESTHESIA:  General.  ASSISTANT:  Lanna Poche, PA.  HISTORY:  This is a 56 year old, previously poorly controlled insulin- dependent diabetic, who had a traumatic injury to the knee and underwent a quad tendon repair.  The patient had persistent symptoms and with repeat MRI was noted to have a partial thickness nonhealing portion of the tendon.  It was torn, that was nonhealing.  In addition, had a medial meniscus tear and DJD of the knee.  He was then meniscus tear noted.  He had knee pain refractory to conservative treatment.  The patient required revision of the quad tendon repair as well as knee arthroscopy and partial meniscectomy; however, they took a period of time to optimize his medical management of his diabetes, prior to proceeding with the revision.  The procedure risk and benefits were discussed including bleeding, infection, and no change in symptoms or worsening of symptoms, DVT, PE, anesthetic complications, need for revision in the future, etc.  TECHNIQUE:  The patient in supine position, after  induction of adequate anesthesia, 2 g Kefzol, the left lower extremity was prepped and draped in usual sterile fashion.  We proceeded first with the arthroscopic portion of the procedure.  A lateral parapatellar portal was fashioned with a #11 blade.  Ingress cannula atraumatically placed.  Irrigant was utilized to insufflate the joint at 65 mmHg.  Under direct visualization, a medial parapatellar portal was fashioned with a #11 blade after localization with 18-gauge needle, sparing the medial meniscus.  Noted was extensive grade 3 changes of the posterior medial femoral condyle with chondral flap tears, excised the chondral flap tears with a straight basket, light chondroplasty as well.  For significant tearing of the posterior 3rd of the medial meniscus, we introduced a combination of basket and a 3.5 shaver to debride it to a stable base.  The anterior 2/3rd was unremarkable.  The remnant was stable to probe.  On palpation, there was some grade 3 changes of the posterior medial tibial plateau.  Probing extensively, the chondral defect, which measured approximately 3 x 3 cm, indicated approximately 2 mm of intact cartilage. ACL was unremarkable.  Lateral compartment revealed an essentially normal femoral condyle and tibial plateau, and meniscus stable to probe palpation.  Suprapatellar pouch revealed extensive  synovitis and chondromalacia of the patellofemoral joint.  No grade 4 changes.  Light chondroplasty was performed here.  There is normal patellofemoral tracking.  There was no evidence of full-thickness tear of the quad tendon, and the deep portion appeared to be intact.  We then removed all instrumentation from the knee after copious lavage and on reexamining all compartments, no further pathology amenable to arthroscopic intervention.  Therefore, this was removed and I closed the portals with 4-0 nylon simple suture. A 2nd sterile drape was applied and the bed was placed into  the plantar position.  Then revised the scar proximal to the patella for an approximate 5 cm.  Excising that and dividing down to the attachment of the quadriceps tendon into the patella.  Since the scar tissue was now noted, we mobilized and identified the quad tendon proximally and at its insertion, I made a longitudinal incision in the region, which I felt as if there was a small palpable defect.  Half the tendon was incised longitudinally.  Good bleeding tissue was encountered and debrided the central port of the tendon, and then repaired it side-to-side with a combination of #1 Vicryl interrupted figure-of-eight suture and #1 Ethibond.  Five sutures were placed.  Copiously irrigated the wound. With flexion and extension of the knee, the tendon was fully intact and patellofemoral tracking was appropriate.  Subcu with 2-0, and skin with staples, wound was dressed sterilely, placed in an immobilizer extubated without difficulty, and transported to the recovery room in satisfactory condition.  The patient tolerated the procedure well.  No complications.  Assistant, Lanna PocheJacqueline Bissell, [place], was used throughout the case for patient positioning and holding in figure 4 the extremity, and closure with minimal blood loss.     Robert EveryJeffrey Scott Fix, M.D.     Cordelia PenJB/MEDQ  D:  09/26/2016  T:  09/26/2016  Job:  [id number]

## 2016-09-26 NOTE — Discharge Instructions (Signed)
Weight bear on heel only in knee immobilizer Keep knee in full extension Do not remove dressings Ice and elevate, toes above the nose, to reduce swelling, 20 minutes at a time, 5-6x/day Take Aspirin 325mg  once daily to prevent blood clots Follow up 10-14 days post-op with Dr. Shelle IronBeane Make a follow up appointment with your primary care physician for diabetes control approximately one week post-op

## 2016-09-26 NOTE — Brief Op Note (Signed)
09/26/2016  8:39 AM  PATIENT:  Robert Sweeney  56 y.o. male  PRE-OPERATIVE DIAGNOSIS:  Quad tendon tear, medial menisectomy tear left knee  POST-OPERATIVE DIAGNOSIS:  Quad tendon tear, medial menisectomy tear left knee  PROCEDURE:  Procedure(s): LEFT KNEE ARTHROSCOPY, PARTIAL MEDIAL MENISECTOMY, DEBRIDEMENT, REVISION QUAD TENDON REPAIR WITH PATCH GRAFT (Left) LEFT KNEE ARTHROSCOPY WITH PARTIAL MEDIAL MENISECTOMY (Left)  SURGEON:  Surgeon(s) and Role:    * Jene EveryJeffrey Zayyan Mullen, MD - Primary  PHYSICIAN ASSISTANT:   ASSISTANTS: Bissell   ANESTHESIA:   general  EBL:  No intake/output data recorded.  BLOOD ADMINISTERED:none  DRAINS: none   LOCAL MEDICATIONS USED:  MARCAINE     SPECIMEN:  No Specimen  DISPOSITION OF SPECIMEN:  N/A  COUNTS:  YES  TOURNIQUET:  * No tourniquets in log *  DICTATION: .Other Dictation: Dictation Number [id number]  PLAN OF CARE: Admit for overnight observation  PATIENT DISPOSITION:  PACU - hemodynamically stable.   Delay start of Pharmacological VTE agent (>24hrs) due to surgical blood loss or risk of bleeding: no

## 2016-09-26 NOTE — Transfer of Care (Signed)
Immediate Anesthesia Transfer of Care Note  Patient: Robert Sweeney  Procedure(s) Performed: Procedure(s): LEFT KNEE ARTHROSCOPY, PARTIAL MEDIAL MENISECTOMY, DEBRIDEMENT, QUAD TENDON REPAIR (Left)  Patient Location: PACU  Anesthesia Type:General  Level of Consciousness: awake  Airway & Oxygen Therapy: Patient Spontanous Breathing and Patient connected to face mask oxygen  Post-op Assessment: Report given to RN and Post -op Vital signs reviewed and stable  Post vital signs: Reviewed and stable  Last Vitals:  Vitals:   09/26/16 0533  BP: (!) 155/62  Pulse: 96  Resp: 18  Temp: 36.8 C    Last Pain:  Vitals:   09/26/16 0533  TempSrc: Oral  PainSc:       Patients Stated Pain Goal: 6 (09/26/16 0531)  Complications: No apparent anesthesia complications

## 2016-10-07 ENCOUNTER — Other Ambulatory Visit: Payer: Self-pay | Admitting: Physician Assistant

## 2016-10-07 DIAGNOSIS — I10 Essential (primary) hypertension: Secondary | ICD-10-CM

## 2016-10-10 ENCOUNTER — Telehealth: Payer: Self-pay

## 2016-10-10 NOTE — Telephone Encounter (Signed)
januvia not covered  Need alt. [phone]

## 2016-10-10 NOTE — Telephone Encounter (Signed)
Metformin not covered either Key [id number]

## 2016-10-11 NOTE — Telephone Encounter (Signed)
Got a request from cover my meds.  They gave me a number to call and the rep said that this is a Worker's Comp case and they do not know if metformin will be covered again.  They will handle the case from here.

## 2016-11-04 ENCOUNTER — Other Ambulatory Visit: Payer: Self-pay | Admitting: Emergency Medicine

## 2016-11-04 DIAGNOSIS — Z794 Long term (current) use of insulin: Principal | ICD-10-CM

## 2016-11-04 DIAGNOSIS — E118 Type 2 diabetes mellitus with unspecified complications: Secondary | ICD-10-CM

## 2016-11-04 NOTE — Telephone Encounter (Signed)
Please call pt and have him schedule appt with me for diabetes check in May. Thank you!

## 2016-11-04 NOTE — Telephone Encounter (Signed)
OK to refill 2 months. Pt need to have OV for DM check.

## 2016-11-06 ENCOUNTER — Telehealth: Payer: Self-pay | Admitting: Family Medicine

## 2016-11-06 NOTE — Telephone Encounter (Signed)
Robert Sweeney PT IS CALLING ABOUT HIS MEDICINE SHE DIDN'T SAY WHY OR WHAT THE PROBLEM WAS HE JUST WANT YOU TO CALL HIM PLEASE RESPOND

## 2016-11-07 NOTE — Telephone Encounter (Signed)
lmtcb

## 2017-01-01 ENCOUNTER — Telehealth: Payer: Self-pay

## 2017-01-01 NOTE — Telephone Encounter (Signed)
Received PA requests from pharmacy for Metformin & Januvia. In note from 11/04/16 pt was advised to rtc for refills. It appears that these medications were filled under a workers comp case previously. LMOV for pt to schedule an appt.

## 2017-02-26 ENCOUNTER — Encounter: Payer: Self-pay | Admitting: Physician Assistant

## 2017-02-26 NOTE — Addendum Note (Signed)
Addendum  created 02/26/17 1345 by Larnie Heart, MD   Sign clinical note    

## 2017-04-12 ENCOUNTER — Emergency Department (HOSPITAL_COMMUNITY)
Admission: EM | Admit: 2017-04-12 | Discharge: 2017-04-12 | Disposition: A | Discharge status: Home / Self Care | Payer: Self-pay | Attending: Emergency Medicine | Admitting: Emergency Medicine

## 2017-04-12 ENCOUNTER — Encounter (HOSPITAL_COMMUNITY): Payer: Self-pay | Admitting: Emergency Medicine

## 2017-04-12 DIAGNOSIS — Z79899 Other long term (current) drug therapy: Secondary | ICD-10-CM | POA: Insufficient documentation

## 2017-04-12 DIAGNOSIS — L259 Unspecified contact dermatitis, unspecified cause: Secondary | ICD-10-CM | POA: Insufficient documentation

## 2017-04-12 DIAGNOSIS — Z7982 Long term (current) use of aspirin: Secondary | ICD-10-CM | POA: Insufficient documentation

## 2017-04-12 DIAGNOSIS — I10 Essential (primary) hypertension: Secondary | ICD-10-CM | POA: Insufficient documentation

## 2017-04-12 DIAGNOSIS — E119 Type 2 diabetes mellitus without complications: Secondary | ICD-10-CM | POA: Insufficient documentation

## 2017-04-12 DIAGNOSIS — Z7984 Long term (current) use of oral hypoglycemic drugs: Secondary | ICD-10-CM | POA: Insufficient documentation

## 2017-04-12 DIAGNOSIS — Z87891 Personal history of nicotine dependence: Secondary | ICD-10-CM | POA: Insufficient documentation

## 2017-04-12 MED ORDER — PREDNISONE 10 MG (21) PO TBPK: ORAL_TABLET | ORAL | 0 refills | Status: DC

## 2017-04-12 MED ORDER — DIPHENHYDRAMINE HCL 25 MG PO CAPS: 25.0000 mg | ORAL_CAPSULE | Freq: Four times a day (QID) | ORAL | 0 refills | Status: AC | PRN

## 2017-04-12 MED ORDER — PREDNISONE 50 MG PO TABS
50.0000 mg | ORAL_TABLET | Freq: Once | ORAL | Status: AC
Start: 2017-04-12 — End: 2017-04-12
  Administered 2017-04-12: 50 mg via ORAL
  Filled 2017-04-12: qty 1

## 2017-04-12 NOTE — ED Triage Notes (Signed)
Pt reports he has had a rash on his lower abd, midline back and R side of neck for the past few days. Tried hydrocortsone cream with no relief.

## 2017-04-12 NOTE — ED Provider Notes (Signed)
[facility] DEPT Provider Note   CSN: [id number] Arrival date & time: 04/12/17  7124     History   Chief Complaint Chief Complaint  Patient presents with  . Rash    HPI Robert Sweeney is a 56 y.o. male.  HPI Patient presents to the emergency room for evaluation of a rash that he's had for the last couple of weeks.  Patient had been doing yard work before started. He is not sure if that's related to the symptoms. He started developing an itchy rash in his right inguinal region as well as his neck and lower back. He's tried applying over-the-counter triamcinolone cream and has also been taking Benadryl. The rash has persisted and still itches although it does seem to be improving slightly. He denies any fevers or chills. He has not noticed any blisters. He denies any shortness of breath. Past Medical History:  Diagnosis Date  . Abnormal stress test    a. 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  . Diabetes mellitus without complication ([place])    type 2  . Headache    sometimes, occ migraines  . History of diabetes mellitus, type II    diet controlled-"no medications needed anymore"  . Hypertension    under control through diet and exercise    Patient Active Problem List   Diagnosis Date Noted  . 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 Hyperlipidemia 08/20/2016  . Abnormal nuclear stress test 01/12/2016  . Preprocedural examination 01/12/2016  . Blood clotting disorder ([place]) 01/12/2016  . Other fatigue 01/12/2016  . Preoperative cardiovascular examination 12/06/2015  . Diabetes ([place]) 11/07/2015  . Essential hypertension 11/07/2015  . Abnormal EKG 11/07/2015  . Tear of tendon of lower extremity 09/22/2014    Past Surgical History:  Procedure Laterality Date  . LEFT HEART CATH AND CORONARY ANGIOGRAPHY N/A 08/23/2016   Procedure: Left Heart Cath and Coronary Angiography;  Surgeon:  Nelva Bush, MD;  Location: [facility];  Service: Cardiovascular;  Laterality: N/A;  . QUADRICEPS TENDON REPAIR Left 09/22/2014   Procedure: REPAIR LEFT QUADRICEP TENDON with patch graft;  Surgeon: Susa Day, MD;  Location: [facility];  Service: Orthopedics;  Laterality: Left;  Marland Kitchen QUADRICEPS TENDON REPAIR Left 09/26/2016   Procedure: LEFT KNEE ARTHROSCOPY, PARTIAL MEDIAL MENISECTOMY, DEBRIDEMENT, QUAD TENDON REPAIR;  Surgeon: Susa Day, MD;  Location: [facility];  Service: Orthopedics;  Laterality: Left;       Home Medications    Prior to Admission medications   Medication Sig Start Date End Date Taking? Authorizing Provider  amLODipine (NORVASC) 5 MG tablet Take 1 tablet (5 mg total) by mouth daily. 08/23/16   End, Harrell Gave, MD  aspirin EC 325 MG tablet Take 1 tablet (325 mg total) by mouth 2 (two) times daily. 09/26/16   Susa Day, MD  atorvastatin (LIPITOR) 20 MG tablet Take 1 tablet (20 mg total) by mouth daily. 06/13/16   McVey, Gelene Mink, PA-C  b complex vitamins tablet Take 1 tablet by mouth daily at 12 noon. Reported on 11/07/2015    [provider]  BEE POLLEN PO Take 1 capsule by mouth daily at 12 noon. Reported on 11/07/2015    [provider]  blood glucose meter kit and supplies KIT Dispense based on patient and insurance preference. Check once a day at differing times 12/08/15   Gale Journey, Damaris Hippo, PA-C  chlorthalidone (HYGROTON) 25 MG tablet TAKE 1 TABLET (25  MG TOTAL) BY MOUTH DAILY. 10/07/16   McVey, Gelene Mink, PA-C  Cinnamon 500 MG TABS Take 500 mg by mouth daily at 12 noon. Reported on 11/07/2015    [provider]  Coenzyme Q10 (CO Q 10 PO) Take 1 capsule by mouth daily. Reported on 11/07/2015    [provider]  dapagliflozin propanediol (FARXIGA) 10 MG TABS tablet Take 10 mg by mouth daily. Patient taking differently: Take 10 mg by mouth daily at 12 noon.  04/26/16   McVey, Gelene Mink, PA-C  diphenhydrAMINE  (BENADRYL) 25 mg capsule Take 1 capsule (25 mg total) by mouth every 6 (six) hours as needed. 04/12/17   Dorie Rank, MD  docusate sodium (COLACE) 100 MG capsule Take 1 capsule (100 mg total) by mouth 2 (two) times daily as needed for mild constipation. 09/26/16   Susa Day, MD  losartan (COZAAR) 100 MG tablet Take 1 tablet (100 mg total) by mouth daily. Patient taking differently: Take 100 mg by mouth daily at 12 noon.  08/05/16   McVey, Gelene Mink, PA-C  metFORMIN (GLUCOPHAGE) 500 MG tablet Take 2 tablets (1,000 mg total) by mouth 2 (two) times daily with a meal. 08/26/16   End, Harrell Gave, MD  metFORMIN (GLUCOPHAGE) 500 MG tablet TAKE 1 TABLET TWICE A DAY WITH A MEAL 11/04/16   McVey, Gelene Mink, PA-C  methocarbamol (ROBAXIN) 500 MG tablet Take 1 tablet (500 mg total) by mouth every 6 (six) hours as needed for muscle spasms. 09/26/16   Susa Day, MD  Multiple Vitamin (MULTIVITAMIN WITH MINERALS) TABS tablet Take 1 tablet by mouth daily at 12 noon. Reported on 11/07/2015    [provider]  oxyCODONE-acetaminophen (PERCOCET) 10-325 MG tablet Take 1 tablet by mouth every 4 (four) hours as needed for pain (severe). 09/26/16   Susa Day, MD  polyethylene glycol (MIRALAX / GLYCOLAX) packet Take 17 g by mouth daily. 09/26/16   Susa Day, MD  predniSONE (STERAPRED UNI-PAK 21 TAB) 10 MG (21) TBPK tablet Take 6 tabs by mouth daily  for 2 days, then 5 tabs for 2 days, then 4 tabs for 2 days, then 3 tabs for 2 days, 2 tabs for 2 days, then 1 tab by mouth daily for 2 days 04/12/17   Dorie Rank, MD  sitaGLIPtin (JANUVIA) 100 MG tablet Take 1 tablet (100 mg total) by mouth daily. Patient taking differently: Take 100 mg by mouth daily at 12 noon.  09/10/16   Harrison Mons, PA-C  zinc gluconate 50 MG tablet Take 50 mg by mouth daily at 12 noon. Reported on 11/07/2015    [provider]    Family History Family History  Problem Relation Age of Onset  . Diabetes Mother   .  Hypertension Mother     Social History Social History  Substance Use Topics  . Smoking status: Former Smoker    Types: Cigarettes    Quit date: 09/28/1981  . Smokeless tobacco: Never Used     Comment: some 40 years ago  . Alcohol use Yes     Comment: rare     Allergies   Patient has no known allergies.   Review of Systems Review of Systems  All other systems reviewed and are negative.    Physical Exam Updated Vital Signs BP (!) 187/100 (BP Location: Left Arm)   Pulse 79   Temp 98.6 F (37 C) (Oral)   Resp 20   Physical Exam  Constitutional: He appears well-developed and well-nourished. No distress.  HENT:  Head: Normocephalic and atraumatic.  Right Ear: External ear normal.  Left Ear: External ear normal.  Eyes: Conjunctivae are normal. Right eye exhibits no discharge. Left eye exhibits no discharge. No scleral icterus.  Neck: Neck supple. No tracheal deviation present.  Cardiovascular: Normal rate.   Pulmonary/Chest: Effort normal. No stridor. No respiratory distress.  Abdominal: He exhibits no distension.  Musculoskeletal: He exhibits no edema.  Neurological: He is alert. Cranial nerve deficit: no gross deficits.  Skin: Skin is warm and dry. Rash noted.  Erythematous rash, small oval and round lesion in a patch in the inguinal waist band area on the right, also in the collar area of the neck and lower back, some hyperpigmentation,  No vesicles or pustules  Psychiatric: He has a normal mood and affect.  Nursing note and vitals reviewed.    ED Treatments / Results  Procedures Procedures (including critical care time)  Medications Ordered in ED Medications  predniSONE (DELTASONE) tablet 50 mg (50 mg Oral Given 04/12/17 1009)     Initial Impression / Assessment and Plan / ED Course  I have reviewed the triage vital signs and the nursing notes.  Pertinent labs & imaging results that were available during my care of the patient were reviewed by me and  considered in my medical decision making (see chart for details).   doubt shingles or herpes type rash with the multiple areas affected.  Doubt bacterial or fungal infection.  ? Contact derm considering the collar and waist band location.  Will dc home with oral steroids.  Continue antihistamine.  Follow up with PCP  At this time there does not appear to be any evidence of an acute emergency medical condition and the patient appears stable for discharge with appropriate outpatient follow up.   Final Clinical Impressions(s) / ED Diagnoses   Final diagnoses:  Contact dermatitis, unspecified contact dermatitis type, unspecified trigger    New Prescriptions New Prescriptions   DIPHENHYDRAMINE (BENADRYL) 25 MG CAPSULE    Take 1 capsule (25 mg total) by mouth every 6 (six) hours as needed.   PREDNISONE (STERAPRED UNI-PAK 21 TAB) 10 MG (21) TBPK TABLET    Take 6 tabs by mouth daily  for 2 days, then 5 tabs for 2 days, then 4 tabs for 2 days, then 3 tabs for 2 days, 2 tabs for 2 days, then 1 tab by mouth daily for 2 days     Dorie Rank, MD 04/12/17 1014

## 2017-04-12 NOTE — Discharge Instructions (Signed)
Follow up with a primary care doctor if not better in the next week, monitor for fever, worsening symptoms

## 2017-05-10 ENCOUNTER — Encounter (HOSPITAL_COMMUNITY): Payer: Self-pay | Admitting: Emergency Medicine

## 2017-05-10 ENCOUNTER — Emergency Department (HOSPITAL_COMMUNITY)
Admission: EM | Admit: 2017-05-10 | Discharge: 2017-05-10 | Disposition: A | Discharge status: Home / Self Care | Payer: Self-pay | Attending: Emergency Medicine | Admitting: Emergency Medicine

## 2017-05-10 DIAGNOSIS — Z87891 Personal history of nicotine dependence: Secondary | ICD-10-CM | POA: Insufficient documentation

## 2017-05-10 DIAGNOSIS — Z7982 Long term (current) use of aspirin: Secondary | ICD-10-CM | POA: Insufficient documentation

## 2017-05-10 DIAGNOSIS — Z79899 Other long term (current) drug therapy: Secondary | ICD-10-CM | POA: Insufficient documentation

## 2017-05-10 DIAGNOSIS — R21 Rash and other nonspecific skin eruption: Secondary | ICD-10-CM | POA: Insufficient documentation

## 2017-05-10 DIAGNOSIS — Z7984 Long term (current) use of oral hypoglycemic drugs: Secondary | ICD-10-CM | POA: Insufficient documentation

## 2017-05-10 DIAGNOSIS — E119 Type 2 diabetes mellitus without complications: Secondary | ICD-10-CM | POA: Insufficient documentation

## 2017-05-10 DIAGNOSIS — I10 Essential (primary) hypertension: Secondary | ICD-10-CM | POA: Insufficient documentation

## 2017-05-10 HISTORY — DX: Rash and other nonspecific skin eruption: R21

## 2017-05-10 MED ORDER — FAMOTIDINE 20 MG PO TABS
20.0000 mg | ORAL_TABLET | Freq: Once | ORAL | Status: AC
  Administered 2017-05-10: 20 mg via ORAL
  Filled 2017-05-10: qty 1

## 2017-05-10 MED ORDER — LORATADINE 10 MG PO TABS: 10.0000 mg | ORAL_TABLET | Freq: Every day | ORAL | 0 refills | Status: AC

## 2017-05-10 MED ORDER — METHYLPREDNISOLONE SODIUM SUCC 125 MG IJ SOLR
80.0000 mg | Freq: Once | INTRAMUSCULAR | Status: AC
Start: 2017-05-10 — End: 2017-05-10
  Administered 2017-05-10: 80 mg via INTRAMUSCULAR
  Filled 2017-05-10: qty 2

## 2017-05-10 MED ORDER — RANITIDINE HCL 150 MG PO TABS: 150.0000 mg | ORAL_TABLET | Freq: Two times a day (BID) | ORAL | 0 refills | Status: AC

## 2017-05-10 MED ORDER — PREDNISONE 50 MG PO TABS: ORAL_TABLET | ORAL | 0 refills | Status: DC

## 2017-05-10 MED ORDER — LORATADINE 10 MG PO TABS
10.0000 mg | ORAL_TABLET | Freq: Once | ORAL | Status: AC
  Administered 2017-05-10: 10 mg via ORAL
  Filled 2017-05-10: qty 1

## 2017-05-10 MED ORDER — HYDROXYZINE HCL 25 MG PO TABS: 25.0000 mg | ORAL_TABLET | Freq: Four times a day (QID) | ORAL | 0 refills | Status: AC

## 2017-05-10 NOTE — Discharge Instructions (Signed)
Take the medication as directed. Look for anything that you eat, drink or come in contact with that may be the cause of the rash. Follow up with Dr. Scharlene GlossHall's office. Return here as needed.

## 2017-05-10 NOTE — ED Triage Notes (Signed)
Pt reports recurrent itching rash over whole body. Pt stated it has been over a month. Last prescription provided some relief

## 2017-05-10 NOTE — ED Provider Notes (Signed)
[facility] DEPT Provider Note   CSN: [id number] Arrival date & time: 05/10/17  1123     History   Chief Complaint Chief Complaint  Patient presents with  . Rash    HPI Robert Sweeney is a 56 y.o. male who presents to the ED with a rash. Patient reports that he was here last month was similar rash and was prescribed Benadryl and steroid pack. Patient states that the rash improved for a while but after medication finished the rash came back worse. The rash is located on both arms, trunk   HPI  Past Medical History:  Diagnosis Date  . Abnormal stress test    a. 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  . Diabetes mellitus without complication ([place])    type 2  . Headache    sometimes, occ migraines  . History of diabetes mellitus, type II    diet controlled-"no medications needed anymore"  . Hypertension    under control through diet and exercise  . Rash     Patient Active Problem List   Diagnosis Date Noted  . 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 Hyperlipidemia 08/20/2016  . Abnormal nuclear stress test 01/12/2016  . Preprocedural examination 01/12/2016  . Blood clotting disorder ([place]) 01/12/2016  . Other fatigue 01/12/2016  . Preoperative cardiovascular examination 12/06/2015  . Diabetes ([place]) 11/07/2015  . Essential hypertension 11/07/2015  . Abnormal EKG 11/07/2015  . Tear of tendon of lower extremity 09/22/2014    Past Surgical History:  Procedure Laterality Date  . LEFT HEART CATH AND CORONARY ANGIOGRAPHY N/A 08/23/2016   Procedure: Left Heart Cath and Coronary Angiography;  Surgeon: Nelva Bush, MD;  Location: [facility];  Service: Cardiovascular;  Laterality: N/A;  . QUADRICEPS TENDON REPAIR Left 09/22/2014   Procedure: REPAIR LEFT QUADRICEP TENDON with patch graft;  Surgeon: Susa Day, MD;  Location: [facility];  Service:  Orthopedics;  Laterality: Left;  Marland Kitchen QUADRICEPS TENDON REPAIR Left 09/26/2016   Procedure: LEFT KNEE ARTHROSCOPY, PARTIAL MEDIAL MENISECTOMY, DEBRIDEMENT, QUAD TENDON REPAIR;  Surgeon: Susa Day, MD;  Location: [facility];  Service: Orthopedics;  Laterality: Left;       Home Medications    Prior to Admission medications   Medication Sig Start Date End Date Taking? Authorizing Provider  amLODipine (NORVASC) 5 MG tablet Take 1 tablet (5 mg total) by mouth daily. 08/23/16   End, Harrell Gave, MD  aspirin EC 325 MG tablet Take 1 tablet (325 mg total) by mouth 2 (two) times daily. 09/26/16   Susa Day, MD  atorvastatin (LIPITOR) 20 MG tablet Take 1 tablet (20 mg total) by mouth daily. 06/13/16   McVey, Gelene Mink, PA-C  b complex vitamins tablet Take 1 tablet by mouth daily at 12 noon. Reported on 11/07/2015    [provider]  BEE POLLEN PO Take 1 capsule by mouth daily at 12 noon. Reported on 11/07/2015    [provider]  blood glucose meter kit and supplies KIT Dispense based on patient and insurance preference. Check once a day at differing times 12/08/15   Gale Journey, Damaris Hippo, PA-C  chlorthalidone (HYGROTON) 25 MG tablet TAKE 1 TABLET (25 MG TOTAL) BY MOUTH DAILY. 10/07/16   McVey, Gelene Mink, PA-C  Cinnamon 500 MG TABS Take 500 mg by mouth daily at 12 noon. Reported on 11/07/2015    [provider]  Coenzyme Q10 (CO Q 10 PO)  Take 1 capsule by mouth daily. Reported on 11/07/2015    [provider]  dapagliflozin propanediol (FARXIGA) 10 MG TABS tablet Take 10 mg by mouth daily. Patient taking differently: Take 10 mg by mouth daily at 12 noon.  04/26/16   McVey, Gelene Mink, PA-C  diphenhydrAMINE (BENADRYL) 25 mg capsule Take 1 capsule (25 mg total) by mouth every 6 (six) hours as needed. 04/12/17   Dorie Rank, MD  docusate sodium (COLACE) 100 MG capsule Take 1 capsule (100 mg total) by mouth 2 (two) times daily as needed for mild constipation. 09/26/16    Susa Day, MD  hydrOXYzine (ATARAX/VISTARIL) 25 MG tablet Take 1 tablet (25 mg total) by mouth every 6 (six) hours. 05/10/17   Ashley Murrain, NP  loratadine (CLARITIN) 10 MG tablet Take 1 tablet (10 mg total) by mouth daily. 05/10/17   Ashley Murrain, NP  losartan (COZAAR) 100 MG tablet Take 1 tablet (100 mg total) by mouth daily. Patient taking differently: Take 100 mg by mouth daily at 12 noon.  08/05/16   McVey, Gelene Mink, PA-C  metFORMIN (GLUCOPHAGE) 500 MG tablet Take 2 tablets (1,000 mg total) by mouth 2 (two) times daily with a meal. 08/26/16   End, Harrell Gave, MD  metFORMIN (GLUCOPHAGE) 500 MG tablet TAKE 1 TABLET TWICE A DAY WITH A MEAL 11/04/16   McVey, Gelene Mink, PA-C  methocarbamol (ROBAXIN) 500 MG tablet Take 1 tablet (500 mg total) by mouth every 6 (six) hours as needed for muscle spasms. 09/26/16   Susa Day, MD  Multiple Vitamin (MULTIVITAMIN WITH MINERALS) TABS tablet Take 1 tablet by mouth daily at 12 noon. Reported on 11/07/2015    [provider]  oxyCODONE-acetaminophen (PERCOCET) 10-325 MG tablet Take 1 tablet by mouth every 4 (four) hours as needed for pain (severe). 09/26/16   Susa Day, MD  polyethylene glycol (MIRALAX / GLYCOLAX) packet Take 17 g by mouth daily. 09/26/16   Susa Day, MD  predniSONE (DELTASONE) 50 MG tablet Starting tomorrow 05/11/17 take one tablet PO daily 05/10/17   Ashley Murrain, NP  ranitidine (ZANTAC) 150 MG tablet Take 1 tablet (150 mg total) by mouth 2 (two) times daily. 05/10/17   Ashley Murrain, NP  sitaGLIPtin (JANUVIA) 100 MG tablet Take 1 tablet (100 mg total) by mouth daily. Patient taking differently: Take 100 mg by mouth daily at 12 noon.  09/10/16   Harrison Mons, PA-C  zinc gluconate 50 MG tablet Take 50 mg by mouth daily at 12 noon. Reported on 11/07/2015    [provider]    Family History Family History  Problem Relation Age of Onset  . Diabetes Mother   . Hypertension Mother     Social  History Social History  Substance Use Topics  . Smoking status: Former Smoker    Types: Cigarettes    Quit date: 09/28/1981  . Smokeless tobacco: Never Used     Comment: some 40 years ago  . Alcohol use Yes     Comment: rare     Allergies   Patient has no known allergies.   Review of Systems Review of Systems  Skin: Positive for rash.  All other systems reviewed and are negative.    Physical Exam Updated Vital Signs BP (!) 184/95 (BP Location: Right Arm)   Pulse 78   Temp 98.1 F (36.7 C) (Oral)   Resp 18   Wt 120.2 kg (265 lb)   SpO2 99%   BMI 31.42 kg/m  Physical Exam  Constitutional: He is oriented to person, place, and time. He appears well-developed and well-nourished. No distress.  HENT:  Head: Normocephalic and atraumatic.  Mouth/Throat: Uvula is midline and mucous membranes are normal. No posterior oropharyngeal edema or posterior oropharyngeal erythema.  Eyes: Pupils are equal, round, and reactive to light. Conjunctivae and EOM are normal.  Neck: Normal range of motion. Neck supple.  Cardiovascular: Normal rate.   Pulmonary/Chest: Effort normal and breath sounds normal.  Abdominal:  See skin exam  Musculoskeletal: Normal range of motion.  Neurological: He is alert and oriented to person, place, and time. No cranial nerve deficit.  Skin: Skin is warm and dry.  Erythematous rash, small oval and round lesions noted to the trunk, arms and upper legs. Also in the collar area of the neck. Few area of hyperpigmentation,  No vesicles or pustules   Psychiatric: He has a normal mood and affect. His behavior is normal.  Nursing note and vitals reviewed.    ED Treatments / Results  Labs (all labs ordered are listed, but only abnormal results are displayed) Labs Reviewed - No data to display  Radiology No results found.  Procedures Procedures (including critical care time)  Medications Ordered in ED Medications  methylPREDNISolone sodium succinate  (SOLU-MEDROL) 125 mg/2 mL injection 80 mg (80 mg Intramuscular Given 05/10/17 1228)  famotidine (PEPCID) tablet 20 mg (20 mg Oral Given 05/10/17 1228)  loratadine (CLARITIN) tablet 10 mg (10 mg Oral Given 05/10/17 1228)     Initial Impression / Assessment and Plan / ED Course  I have reviewed the triage vital signs and the nursing notes. 56 y.o. male with rash that has been persistent since last ED visit, discussed with the patient need for f/u with dermatology. For now will treat symptoms with Atarax, prednisone, Claritin and Zantac. Patient agrees with plan. Discussed in detail possible causes of rash and keeping a list of new clothes, food, detergent, lotions, etc. In an effort to try and find what may be causing the rash but to schedule an appointment with dermatology.patient agrees with plan.  Final Clinical Impressions(s) / ED Diagnoses   Final diagnoses:  Rash    New Prescriptions Discharge Medication List as of 05/10/2017 12:22 PM    START taking these medications   Details  hydrOXYzine (ATARAX/VISTARIL) 25 MG tablet Take 1 tablet (25 mg total) by mouth every 6 (six) hours., Starting Sat 05/10/2017, Print    loratadine (CLARITIN) 10 MG tablet Take 1 tablet (10 mg total) by mouth daily., Starting Sat 05/10/2017, Print    predniSONE (DELTASONE) 50 MG tablet Starting tomorrow 05/11/17 take one tablet PO daily, Print    ranitidine (ZANTAC) 150 MG tablet Take 1 tablet (150 mg total) by mouth 2 (two) times daily., Starting Sat 05/10/2017, Print         [place], [place], [place] 05/10/17 2106    Nat Christen, MD 05/11/17 1018

## 2017-05-16 ENCOUNTER — Telehealth: Payer: Self-pay | Admitting: Internal Medicine

## 2017-05-16 NOTE — Telephone Encounter (Signed)
New message    Robert Sweeney is calling from [employer]. She states she sent the release of information and got part of the records. She wants to know if they can be refaxed to [phone].

## 2017-07-21 ENCOUNTER — Emergency Department (HOSPITAL_COMMUNITY): Payer: Self-pay

## 2017-07-21 ENCOUNTER — Encounter (HOSPITAL_COMMUNITY): Payer: Self-pay | Admitting: Emergency Medicine

## 2017-07-21 ENCOUNTER — Emergency Department (HOSPITAL_COMMUNITY)
Admission: EM | Admit: 2017-07-21 | Discharge: 2017-07-21 | Disposition: A | Discharge status: Home / Self Care | Payer: Self-pay | Attending: Emergency Medicine | Admitting: Emergency Medicine

## 2017-07-21 ENCOUNTER — Other Ambulatory Visit: Payer: Self-pay

## 2017-07-21 DIAGNOSIS — L253 Unspecified contact dermatitis due to other chemical products: Secondary | ICD-10-CM | POA: Insufficient documentation

## 2017-07-21 DIAGNOSIS — Z794 Long term (current) use of insulin: Secondary | ICD-10-CM

## 2017-07-21 DIAGNOSIS — I10 Essential (primary) hypertension: Secondary | ICD-10-CM

## 2017-07-21 DIAGNOSIS — R05 Cough: Secondary | ICD-10-CM

## 2017-07-21 DIAGNOSIS — R059 Cough, unspecified: Secondary | ICD-10-CM

## 2017-07-21 DIAGNOSIS — J209 Acute bronchitis, unspecified: Secondary | ICD-10-CM | POA: Insufficient documentation

## 2017-07-21 DIAGNOSIS — E118 Type 2 diabetes mellitus with unspecified complications: Secondary | ICD-10-CM

## 2017-07-21 MED ORDER — ATORVASTATIN CALCIUM 20 MG PO TABS: 20.0000 mg | ORAL_TABLET | Freq: Every day | ORAL | 2 refills | Status: AC

## 2017-07-21 MED ORDER — ALBUTEROL SULFATE HFA 108 (90 BASE) MCG/ACT IN AERS
2.0000 | INHALATION_SPRAY | RESPIRATORY_TRACT | Status: DC
  Administered 2017-07-21: 2 via RESPIRATORY_TRACT

## 2017-07-21 MED ORDER — AMLODIPINE BESYLATE 5 MG PO TABS: 5.0000 mg | ORAL_TABLET | Freq: Every day | ORAL | 2 refills | Status: AC

## 2017-07-21 MED ORDER — CHLORTHALIDONE 25 MG PO TABS: 25.0000 mg | ORAL_TABLET | Freq: Every day | ORAL | 2 refills | Status: AC

## 2017-07-21 MED ORDER — METFORMIN HCL 500 MG PO TABS: 500.0000 mg | ORAL_TABLET | Freq: Two times a day (BID) | ORAL | 2 refills | Status: AC

## 2017-07-21 MED ORDER — AZITHROMYCIN 250 MG PO TABS: 250.0000 mg | ORAL_TABLET | Freq: Every day | ORAL | 0 refills | Status: AC

## 2017-07-21 MED ORDER — IPRATROPIUM-ALBUTEROL 0.5-2.5 (3) MG/3ML IN SOLN
3.0000 mL | Freq: Once | RESPIRATORY_TRACT | Status: AC
  Administered 2017-07-21: 3 mL via RESPIRATORY_TRACT
  Filled 2017-07-21: qty 3

## 2017-07-21 MED ORDER — PREDNISONE 20 MG PO TABS
60.0000 mg | ORAL_TABLET | Freq: Every day | ORAL | Status: DC
  Administered 2017-07-21: 60 mg via ORAL
  Filled 2017-07-21: qty 3

## 2017-07-21 MED ORDER — PREDNISONE 10 MG PO TABS: ORAL_TABLET | ORAL | 0 refills | Status: AC

## 2017-07-21 NOTE — ED Triage Notes (Signed)
Patient reports rash on lower abd and back for about month. reports that he was seen here in November for same and was given medications which helped.

## 2017-07-21 NOTE — ED Notes (Signed)
Pt refused vitals 

## 2017-07-21 NOTE — ED Provider Notes (Signed)
Patient placed in Quick Look pathway, seen and evaluated.  Chief Complaint: rash, concern for shingles; productive cough for 3 months  HPI:  Rash across lower abdomen, midline of back; intermittent productive cough for 3 mo, worse at night  ROS: +rash,  +prod cough  Physical Exam:   Gen: No distress  Neuro: Awake and Alert  Skin: Warm  Focused Exam: hyperpigmented and dry rash across lower abdomen, crossing midline; at midline of lower back, no blistering, vesicles, erythema; lungs CTAB, RRR   Initiation of care has begun. The patient has been counseled on the process, plan, and necessity for staying for the completion/evaluation, and the remainder of the medical screening examination   Robert Sweeney, Robert Brandle, PA-C 07/21/17 1329    Pricilla LovelessGoldston, Scott, MD 07/21/17 1810

## 2017-07-21 NOTE — ED Notes (Signed)
Pt is alert and oriented x 4 and is verbally responsive. Pt reports SOB with minimal exertion that has been progressive x 2 months. Pt does report that he has had a rash x 2 months intermittently. Pt has some flat and fleshed color Rashes noted to lower on back and waistline. PT reports that they are itchy

## 2017-07-21 NOTE — ED Triage Notes (Signed)
Patient adds that he had cough with yellow sputum for months esp at night.

## 2018-01-02 IMAGING — NM NM MISC PROCEDURE
6 series · 36 of 36 positions shown · non-contrast
Comparison: none

[Series 1: wbr_r-proj_st wbr rest · 6.40mm/px · 6 of 64 frames shown]
[frame 6/64]
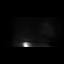
[frame 16/64]
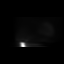
[frame 27/64]
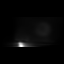
[frame 38/64]
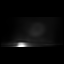
[frame 48/64]
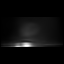
[frame 59/64]
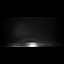

[Series 1: wbr rest · 6.40mm/px · 6 of 64 frames shown]
[frame 6/64]
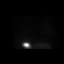
[frame 16/64]
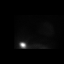
[frame 27/64]
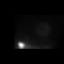
[frame 38/64]
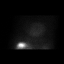
[frame 48/64]
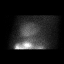
[frame 59/64]
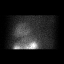

[Series 2: wbr_s-proj_st wbr stress-gsp · 6.40mm/px · 6 of 512 frames shown]
[frame 43/512]
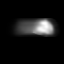
[frame 128/512]
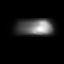
[frame 214/512]
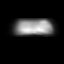
[frame 299/512]
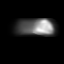
[frame 384/512]
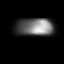
[frame 470/512]
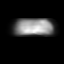

[Series 2: wbr stress-gsp · 6.40mm/px · 6 of 512 frames shown]
[frame 43/512]
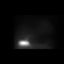
[frame 128/512]
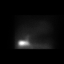
[frame 214/512]
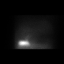
[frame 299/512]
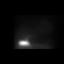
[frame 384/512]
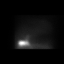
[frame 470/512]
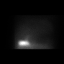

[Series 3: wbr stress-sum-em · 6.40mm/px · 6 of 64 frames shown]
[frame 6/64]
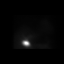
[frame 16/64]
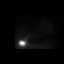
[frame 27/64]
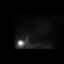
[frame 38/64]
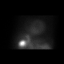
[frame 48/64]
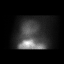
[frame 59/64]
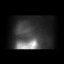

[Series 3: wbr_s-proj_st wbr stress-sum-em · 6.40mm/px · 6 of 64 frames shown]
[frame 6/64]
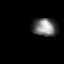
[frame 16/64]
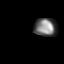
[frame 27/64]
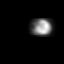
[frame 38/64]
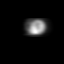
[frame 48/64]
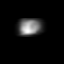
[frame 59/64]
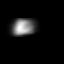

[36 of 36 positions shown; findings below may reference images not displayed]

Canned report from images found in remote index.

Refer to host system for actual result text.

## 2018-11-18 ENCOUNTER — Other Ambulatory Visit: Payer: Self-pay | Admitting: Family Medicine

## 2018-11-18 ENCOUNTER — Ambulatory Visit
Admission: RE | Admit: 2018-11-18 | Discharge: 2018-11-18 | Disposition: A | Discharge status: Home / Self Care | Payer: BLUE CROSS/BLUE SHIELD | Source: Ambulatory Visit | Attending: Family Medicine | Admitting: Family Medicine

## 2018-11-18 DIAGNOSIS — R0689 Other abnormalities of breathing: Secondary | ICD-10-CM

## 2019-04-21 ENCOUNTER — Other Ambulatory Visit: Payer: Self-pay | Admitting: Family Medicine

## 2019-04-21 ENCOUNTER — Ambulatory Visit
Admission: RE | Admit: 2019-04-21 | Discharge: 2019-04-21 | Disposition: A | Discharge status: Home / Self Care | Payer: BLUE CROSS/BLUE SHIELD | Source: Ambulatory Visit | Attending: Family Medicine | Admitting: Family Medicine

## 2019-04-21 DIAGNOSIS — R52 Pain, unspecified: Secondary | ICD-10-CM
